# Patient Record
Sex: Female | Born: 1935 | Race: White | Hispanic: No | Marital: Single | Smoking: Never smoker
Health system: Southern US, Community
[De-identification: ages and names within clinical notes are randomized; demographics above are authoritative.]

## PROBLEM LIST (undated history)

## (undated) DIAGNOSIS — M5030 Other cervical disc degeneration, unspecified cervical region: Secondary | ICD-10-CM

## (undated) DIAGNOSIS — F0280 Dementia in other diseases classified elsewhere without behavioral disturbance: Secondary | ICD-10-CM

## (undated) DIAGNOSIS — I6529 Occlusion and stenosis of unspecified carotid artery: Secondary | ICD-10-CM

## (undated) DIAGNOSIS — M8580 Other specified disorders of bone density and structure, unspecified site: Secondary | ICD-10-CM

## (undated) DIAGNOSIS — G309 Alzheimer's disease, unspecified: Secondary | ICD-10-CM

## (undated) DIAGNOSIS — E559 Vitamin D deficiency, unspecified: Secondary | ICD-10-CM

## (undated) DIAGNOSIS — I739 Peripheral vascular disease, unspecified: Secondary | ICD-10-CM

## (undated) DIAGNOSIS — I10 Essential (primary) hypertension: Secondary | ICD-10-CM

## (undated) DIAGNOSIS — E785 Hyperlipidemia, unspecified: Secondary | ICD-10-CM

## (undated) HISTORY — PX: CORONARY ANGIOPLASTY: SHX604

---

## 2017-07-17 ENCOUNTER — Emergency Department: Payer: Medicare Other

## 2017-07-17 ENCOUNTER — Other Ambulatory Visit: Payer: Self-pay

## 2017-07-17 ENCOUNTER — Emergency Department
Admission: EM | Admit: 2017-07-17 | Discharge: 2017-07-17 | Disposition: A | Discharge status: Home / Self Care | Payer: Medicare Other | Attending: Emergency Medicine | Admitting: Emergency Medicine

## 2017-07-17 DIAGNOSIS — W19XXXA Unspecified fall, initial encounter: Secondary | ICD-10-CM

## 2017-07-17 DIAGNOSIS — R8271 Bacteriuria: Secondary | ICD-10-CM | POA: Diagnosis not present

## 2017-07-17 DIAGNOSIS — W010XXA Fall on same level from slipping, tripping and stumbling without subsequent striking against object, initial encounter: Secondary | ICD-10-CM | POA: Insufficient documentation

## 2017-07-17 DIAGNOSIS — F0390 Unspecified dementia without behavioral disturbance: Secondary | ICD-10-CM | POA: Insufficient documentation

## 2017-07-17 DIAGNOSIS — M25552 Pain in left hip: Secondary | ICD-10-CM | POA: Insufficient documentation

## 2017-07-17 DIAGNOSIS — M25532 Pain in left wrist: Secondary | ICD-10-CM | POA: Diagnosis present

## 2017-07-17 LAB — URINALYSIS, COMPLETE (UACMP) WITH MICROSCOPIC
Bilirubin Urine: NEGATIVE
Glucose, UA: NEGATIVE mg/dL
Hgb urine dipstick: NEGATIVE
KETONES UR: NEGATIVE mg/dL
Leukocytes, UA: NEGATIVE
NITRITE: NEGATIVE
PROTEIN: NEGATIVE mg/dL
RBC / HPF: NONE SEEN RBC/hpf (ref 0–5)
Specific Gravity, Urine: 1.008 (ref 1.005–1.030)
WBC UA: NONE SEEN WBC/hpf (ref 0–5)
pH: 6 (ref 5.0–8.0)

## 2017-07-17 LAB — CBC WITH DIFFERENTIAL/PLATELET
Basophils Absolute: 0.1 10*3/uL (ref 0–0.1)
Basophils Relative: 1 %
EOS ABS: 0.1 10*3/uL (ref 0–0.7)
Eosinophils Relative: 1 %
HEMATOCRIT: 40.9 % (ref 35.0–47.0)
HEMOGLOBIN: 13.5 g/dL (ref 12.0–16.0)
Lymphocytes Relative: 21 %
Lymphs Abs: 2 10*3/uL (ref 1.0–3.6)
MCH: 30.8 pg (ref 26.0–34.0)
MCHC: 33.1 g/dL (ref 32.0–36.0)
MCV: 92.9 fL (ref 80.0–100.0)
Monocytes Absolute: 0.7 10*3/uL (ref 0.2–0.9)
Monocytes Relative: 7 %
NEUTROS ABS: 6.7 10*3/uL — AB (ref 1.4–6.5)
NEUTROS PCT: 70 %
Platelets: 268 10*3/uL (ref 150–440)
RBC: 4.4 MIL/uL (ref 3.80–5.20)
RDW: 13.1 % (ref 11.5–14.5)
WBC: 9.7 10*3/uL (ref 3.6–11.0)

## 2017-07-17 LAB — BASIC METABOLIC PANEL
Anion gap: 10 (ref 5–15)
BUN: 26 mg/dL — AB (ref 6–20)
CHLORIDE: 105 mmol/L (ref 101–111)
CO2: 26 mmol/L (ref 22–32)
CREATININE: 0.89 mg/dL (ref 0.44–1.00)
Calcium: 9.2 mg/dL (ref 8.9–10.3)
GFR calc non Af Amer: 53 mL/min — ABNORMAL LOW (ref >60)
Glucose, Bld: 105 mg/dL — ABNORMAL HIGH (ref 65–99)
POTASSIUM: 3.8 mmol/L (ref 3.5–5.1)
SODIUM: 141 mmol/L (ref 135–145)

## 2017-07-17 MED ORDER — ACETAMINOPHEN 325 MG PO TABS
650.0000 mg | ORAL_TABLET | Freq: Once | ORAL | Status: AC
  Administered 2017-07-17: 650 mg via ORAL

## 2017-07-17 NOTE — Discharge Instructions (Addendum)
Please have your doctor follow up your urine culture results.  Please take all precautions to prevent falls.  You may take Tylenol for your pain.  Return to the emergency department for severe pain, numbness tingling or weakness, fever or any other symptoms concerning to you.

## 2017-07-17 NOTE — ED Triage Notes (Signed)
[facility]. This fall was unwitnessed and found on floor. 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  PT is A/ox4 pt has hx of dementia. Awaiting EDP

## 2017-07-17 NOTE — ED Notes (Signed)
Labs sent at this time.

## 2017-07-17 NOTE — ED Provider Notes (Signed)
[facility] Emergency Department Provider Note  ____________________________________________  Time seen: Approximately 7:41 AM  I have reviewed the triage vital signs and the nursing notes.   HISTORY  Chief Complaint Fall    HPI Denise Cantu is a 84 y.o. female with a history of dementia presenting for an unwitnessed fall.  The patient does not remember the fall and is unable to give me any additional information.  She only reports pain in her left wrist at the site where an IV has been placed.  Per report, the patient was found on the ground in her nursing facility.  History reviewed. No pertinent past medical history.  There are no active problems to display for this patient.   History reviewed. No pertinent surgical history.    Allergies Patient has no allergy information on record.  No family history on file.  Social History Social History   Tobacco Use  . Smoking status: Never Smoker  . Smokeless tobacco: Never Used  Substance Use Topics  . Alcohol use: Never    Frequency: Never  . Drug use: Never    Review of Systems Limited due to patient dementia.  ____________________________________________   PHYSICAL EXAM:  VITAL SIGNS: ED Triage Vitals  Enc Vitals Group     BP 07/17/17 0720 (!) 157/68     Pulse --      Resp 07/17/17 0720 19     Temp --      Temp src --      SpO2 07/17/17 0720 99 %     Weight 07/17/17 0721 180 lb (81.6 kg)     Height 07/17/17 0721 5\' 5"  (1.651 m)     Head Circumference --      Peak Flow --      Pain Score 07/17/17 0720 3     Pain Loc --      Pain Edu? --      Excl. in GC? --     Constitutional: Patient is alert and oriented to person and place but not year.  She is well-appearing for her age and nontoxic in appearance. Eyes: Conjunctivae are normal.  EOMI. No scleral icterus.  No raccoon eyes. Head: Atraumatic.  No battle sign. Nose: No congestion/rhinnorhea.  No swelling over the nose or  septal hematoma. Mouth/Throat: Mucous membranes are moist.  No dental injury or malocclusion. Neck: No stridor.  Supple.  No meningismus.  No midline C-spine tenderness to palpation, step-offs or deformities. Cardiovascular: Normal rate, regular rhythm. No murmurs, rubs or gallops.  Respiratory: Normal respiratory effort.  No accessory muscle use or retractions. Lungs CTAB.  No wheezes, rales or ronchi. Gastrointestinal: Soft, nontender and nondistended.  No guarding or rebound.  No peritoneal signs. Musculoskeletal: Pelvis is stable.  The patient does have pain with range of motion of the left hip.  She has full range of motion without pain of the bilateral shoulders, elbows, wrists, right hip, bilateral knees and ankles.  Normal DP and PT pulses bilaterally.  The patient has focal tenderness to palpation over the IV site in the left wrist without pain with range of motion.  No LE edema. No ttp in the calves or palpable cords.  Negative Homan's sign. Neurologic:  A&Ox2.  Speech is clear.  Face and smile are symmetric.  EOMI. LI.  Moves all extremities well. Skin:  Skin is warm, dry and intact. No rash noted. Psychiatric: Mood and affect are normal. ____________________________________________   LABS (all labs ordered are listed, but  only abnormal results are displayed)  Labs Reviewed  CBC WITH DIFFERENTIAL/PLATELET - Abnormal; Notable for the following components:      Result Value   Neutro Abs 6.7 (*)    All other components within normal limits  BASIC METABOLIC PANEL - Abnormal; Notable for the following components:   Glucose, Bld 105 (*)    BUN 26 (*)    GFR calc non Af Amer 53 (*)    All other components within normal limits  URINALYSIS, COMPLETE (UACMP) WITH MICROSCOPIC - Abnormal; Notable for the following components:   Color, Urine STRAW (*)    APPearance CLEAR (*)    Bacteria, UA RARE (*)    Squamous Epithelial / LPF 0-5 (*)    All other components within normal limits  URINE  CULTURE  TROPONIN I   ____________________________________________  EKG  ED ECG REPORT I, Rockne MenghiniNorman, Anne-Caroline, the attending physician, personally viewed and interpreted this ECG.   Date: 07/17/2017  EKG Time: 722  Rate: 78  Rhythm: normal sinus rhythm  Axis: leftward  Intervals:none  ST&T Change: No STEMI  ____________________________________________  RADIOLOGY  Ct Head Wo Contrast  Result Date: 07/17/2017 CLINICAL DATA:  Unwitnessed fall.  Dementia EXAM: CT HEAD WITHOUT CONTRAST TECHNIQUE: Contiguous axial images were obtained from the base of the skull through the vertex without intravenous contrast. COMPARISON:  None. FINDINGS: Brain: There is moderate diffuse atrophy. There is no intracranial mass, hemorrhage, extra-axial fluid collection, or midline shift. There is patchy small vessel disease throughout the centra semiovale bilaterally. Small vessel disease is noted in each thalamus. No acute infarct evident. Vascular: No hyperdense vessels are evident. There is calcification in the distal right vertebral artery as well as in each carotid siphon region. Skull: Bony calvarium appears intact. Sinuses/Orbits: There is opacification in multiple ethmoid air cells, more on the right than on the left. There is complete opacification of sphenoid sinuses bilaterally with areas of calcification in the left sphenoid sinus. There is bony expansion along the posterior aspect of the sphenoid sinus region. Visualized orbits appear symmetric bilaterally. Other: There is opacification in several mastoid air cells medially on the right. Other visualized paranasal sinuses are clear. There is debris in each external auditory canal. There is extensive degenerative change in the left temporomandibular joint. IMPRESSION: 1. Atrophy with supratentorial small vessel disease. No acute infarct. No mass or hemorrhage. 2.  Foci of arterial vascular calcification. 3. Diffuse opacification of the sphenoid sinuses  bilaterally with areas of calcification, likely due to chronic fungal colonization. Expansion of opacification from the sphenoid sinuses posteriorly noted, likely with polypoid change in these areas. No lytic appearing bone lesion evident. There is also fairly extensive ethmoid sinus disease. 4. Mild medial right mastoid disease. Probable cerumen in each external auditory canal. 5. Extensive degenerative change in the left temporomandibular joint. Electronically Signed   By: Bretta BangWilliam  Woodruff III M.D.   On: 07/17/2017 08:06   Ct Hip Left Wo Contrast  Result Date: 07/17/2017 CLINICAL DATA:  Status post fall.  Left hip pain. EXAM: CT OF THE LEFT HIP WITHOUT CONTRAST TECHNIQUE: Multidetector CT imaging of the left hip was performed according to the standard protocol. Multiplanar CT image reconstructions were also generated. COMPARISON:  None. FINDINGS: Bones/Joint/Cartilage No acute fracture or dislocation. No aggressive osseous lesion. Minimal left hip joint space narrowing. Normal alignment. No joint effusion. Ligaments Ligaments are suboptimally evaluated by CT. Muscles and Tendons Muscles are normal.  No muscle atrophy. Soft tissue No fluid collection or hematoma.  No soft tissue mass. Diverticulosis of the sigmoid colon without evidence of diverticulitis. Peripheral vascular atherosclerotic disease. IMPRESSION: No acute osseous injury of the left hip. Electronically Signed   By: Elige Ko   On: 07/17/2017 10:10   Dg Hip Unilat W Or Wo Pelvis 2-3 Views Left  Result Date: 07/17/2017 CLINICAL DATA:  Pain following fall EXAM: DG HIP (WITH OR WITHOUT PELVIS) 2-3V LEFT COMPARISON:  None. FINDINGS: Frontal pelvis as well as frontal and lateral left hip images were obtained. There is no evident fracture or dislocation. There is moderate narrowing of both hip joints. Bones are osteoporotic. There is calcification in the right and left common iliac arteries, more notable on the right than on the left. Sacroiliac  joints appear normal. IMPRESSION: Moderate narrowing each hip joint. Bones osteoporotic. No acute fracture or dislocation. Electronically Signed   By: Bretta Bang III M.D.   On: 07/17/2017 08:33    ____________________________________________   PROCEDURES  Procedure(s) performed: None  Procedures  Critical Care performed: No ____________________________________________   INITIAL IMPRESSION / ASSESSMENT AND PLAN / ED COURSE  Pertinent labs & imaging results that were available during my care of the patient were reviewed by me and considered in my medical decision making (see chart for details).  84 y.o. female with dementia presenting for an unwitnessed fall, has left hip pain on my examination.  Overall, the patient is hemodynamically stable.  Given that she is unable to give any history, will obtain a CT scan of her head, imaging of her left hip, and basic laboratory studies including urinalysis.  An EKG has also been ordered.  I will treat the patient's pain with Tylenol and reevaluate her for final disposition.  Clinical course:  The patient's workup in the emergency department was reassuring.  She had an x-ray that was negative but given her hip pain, I did proceed with a CT scan which was also negative for any acute fracture.  The patient CT head was negative.  The patient's laboratory studies, EKG, were reassuring.  Clinically, she felt significantly better after Tylenol.  I had a long discussion with the patient and her son about her findings.  The patient was discharged in stable condition with close follow-up instructions; return precautions were discussed  ____________________________________________  FINAL CLINICAL IMPRESSION(S) / ED DIAGNOSES  Final diagnoses:  Bacteriuria  Left hip pain  Fall, initial encounter         NEW MEDICATIONS STARTED DURING THIS VISIT:  There are no discharge medications for this patient.     Rockne Menghini,  MD 07/17/17 1459

## 2017-07-19 LAB — URINE CULTURE

## 2017-07-21 ENCOUNTER — Observation Stay
Admission: EM | Admit: 2017-07-21 | Discharge: 2017-07-22 | Disposition: A | Discharge status: Nursing Home (Includes ICF) | Payer: Medicare Other | Attending: Internal Medicine | Admitting: Internal Medicine

## 2017-07-21 ENCOUNTER — Emergency Department: Payer: Medicare Other

## 2017-07-21 ENCOUNTER — Other Ambulatory Visit: Payer: Self-pay

## 2017-07-21 ENCOUNTER — Observation Stay: Payer: Medicare Other

## 2017-07-21 ENCOUNTER — Encounter: Payer: Self-pay | Admitting: Emergency Medicine

## 2017-07-21 DIAGNOSIS — Z8249 Family history of ischemic heart disease and other diseases of the circulatory system: Secondary | ICD-10-CM | POA: Diagnosis not present

## 2017-07-21 DIAGNOSIS — E559 Vitamin D deficiency, unspecified: Secondary | ICD-10-CM | POA: Diagnosis not present

## 2017-07-21 DIAGNOSIS — F0280 Dementia in other diseases classified elsewhere without behavioral disturbance: Secondary | ICD-10-CM | POA: Insufficient documentation

## 2017-07-21 DIAGNOSIS — Y998 Other external cause status: Secondary | ICD-10-CM | POA: Insufficient documentation

## 2017-07-21 DIAGNOSIS — S42212A Unspecified displaced fracture of surgical neck of left humerus, initial encounter for closed fracture: Principal | ICD-10-CM | POA: Insufficient documentation

## 2017-07-21 DIAGNOSIS — I10 Essential (primary) hypertension: Secondary | ICD-10-CM | POA: Insufficient documentation

## 2017-07-21 DIAGNOSIS — W050XXA Fall from non-moving wheelchair, initial encounter: Secondary | ICD-10-CM | POA: Diagnosis not present

## 2017-07-21 DIAGNOSIS — I739 Peripheral vascular disease, unspecified: Secondary | ICD-10-CM | POA: Diagnosis not present

## 2017-07-21 DIAGNOSIS — S7002XA Contusion of left hip, initial encounter: Secondary | ICD-10-CM | POA: Insufficient documentation

## 2017-07-21 DIAGNOSIS — M47812 Spondylosis without myelopathy or radiculopathy, cervical region: Secondary | ICD-10-CM | POA: Insufficient documentation

## 2017-07-21 DIAGNOSIS — Z7982 Long term (current) use of aspirin: Secondary | ICD-10-CM | POA: Diagnosis not present

## 2017-07-21 DIAGNOSIS — G309 Alzheimer's disease, unspecified: Secondary | ICD-10-CM | POA: Insufficient documentation

## 2017-07-21 DIAGNOSIS — M160 Bilateral primary osteoarthritis of hip: Secondary | ICD-10-CM | POA: Diagnosis not present

## 2017-07-21 DIAGNOSIS — I6523 Occlusion and stenosis of bilateral carotid arteries: Secondary | ICD-10-CM | POA: Insufficient documentation

## 2017-07-21 DIAGNOSIS — Z79899 Other long term (current) drug therapy: Secondary | ICD-10-CM | POA: Diagnosis not present

## 2017-07-21 DIAGNOSIS — Z66 Do not resuscitate: Secondary | ICD-10-CM | POA: Diagnosis not present

## 2017-07-21 DIAGNOSIS — M8580 Other specified disorders of bone density and structure, unspecified site: Secondary | ICD-10-CM | POA: Diagnosis not present

## 2017-07-21 DIAGNOSIS — E785 Hyperlipidemia, unspecified: Secondary | ICD-10-CM | POA: Insufficient documentation

## 2017-07-21 DIAGNOSIS — Y9389 Activity, other specified: Secondary | ICD-10-CM | POA: Insufficient documentation

## 2017-07-21 DIAGNOSIS — M25412 Effusion, left shoulder: Secondary | ICD-10-CM | POA: Diagnosis not present

## 2017-07-21 DIAGNOSIS — Y92129 Unspecified place in nursing home as the place of occurrence of the external cause: Secondary | ICD-10-CM | POA: Diagnosis not present

## 2017-07-21 DIAGNOSIS — S42309A Unspecified fracture of shaft of humerus, unspecified arm, initial encounter for closed fracture: Secondary | ICD-10-CM | POA: Diagnosis present

## 2017-07-21 DIAGNOSIS — E860 Dehydration: Secondary | ICD-10-CM | POA: Insufficient documentation

## 2017-07-21 HISTORY — DX: Essential (primary) hypertension: I10

## 2017-07-21 HISTORY — DX: Dementia in other diseases classified elsewhere, unspecified severity, without behavioral disturbance, psychotic disturbance, mood disturbance, and anxiety: F02.80

## 2017-07-21 HISTORY — DX: Peripheral vascular disease, unspecified: I73.9

## 2017-07-21 HISTORY — DX: Other cervical disc degeneration, unspecified cervical region: M50.30

## 2017-07-21 HISTORY — DX: Other specified disorders of bone density and structure, unspecified site: M85.80

## 2017-07-21 HISTORY — DX: Alzheimer's disease, unspecified: G30.9

## 2017-07-21 HISTORY — DX: Vitamin D deficiency, unspecified: E55.9

## 2017-07-21 HISTORY — DX: Hyperlipidemia, unspecified: E78.5

## 2017-07-21 HISTORY — DX: Occlusion and stenosis of unspecified carotid artery: I65.29

## 2017-07-21 LAB — CBC WITH DIFFERENTIAL/PLATELET
Basophils Absolute: 0.1 10*3/uL (ref 0–0.1)
Basophils Relative: 1 %
EOS PCT: 2 %
Eosinophils Absolute: 0.2 10*3/uL (ref 0–0.7)
HCT: 40.2 % (ref 35.0–47.0)
Hemoglobin: 13.3 g/dL (ref 12.0–16.0)
LYMPHS ABS: 2.2 10*3/uL (ref 1.0–3.6)
LYMPHS PCT: 23 %
MCH: 30.4 pg (ref 26.0–34.0)
MCHC: 33 g/dL (ref 32.0–36.0)
MCV: 92.3 fL (ref 80.0–100.0)
MONO ABS: 0.8 10*3/uL (ref 0.2–0.9)
MONOS PCT: 8 %
Neutro Abs: 6.6 10*3/uL — ABNORMAL HIGH (ref 1.4–6.5)
Neutrophils Relative %: 66 %
PLATELETS: 250 10*3/uL (ref 150–440)
RBC: 4.36 MIL/uL (ref 3.80–5.20)
RDW: 13.3 % (ref 11.5–14.5)
WBC: 9.9 10*3/uL (ref 3.6–11.0)

## 2017-07-21 LAB — COMPREHENSIVE METABOLIC PANEL
ALT: 15 U/L (ref 14–54)
AST: 21 U/L (ref 15–41)
Albumin: 3.9 g/dL (ref 3.5–5.0)
Alkaline Phosphatase: 85 U/L (ref 38–126)
Anion gap: 7 (ref 5–15)
BUN: 29 mg/dL — ABNORMAL HIGH (ref 6–20)
CHLORIDE: 103 mmol/L (ref 101–111)
CO2: 27 mmol/L (ref 22–32)
CREATININE: 0.92 mg/dL (ref 0.44–1.00)
Calcium: 9.1 mg/dL (ref 8.9–10.3)
GFR, EST AFRICAN AMERICAN: 59 mL/min — AB (ref >60)
GFR, EST NON AFRICAN AMERICAN: 51 mL/min — AB (ref >60)
Glucose, Bld: 116 mg/dL — ABNORMAL HIGH (ref 65–99)
POTASSIUM: 3.7 mmol/L (ref 3.5–5.1)
Sodium: 137 mmol/L (ref 135–145)
TOTAL PROTEIN: 6.7 g/dL (ref 6.5–8.1)
Total Bilirubin: 0.7 mg/dL (ref 0.3–1.2)

## 2017-07-21 LAB — TROPONIN I

## 2017-07-21 LAB — MRSA PCR SCREENING: MRSA by PCR: NEGATIVE

## 2017-07-21 MED ORDER — HYDRALAZINE HCL 20 MG/ML IJ SOLN
INTRAMUSCULAR | Status: AC
  Filled 2017-07-21: qty 1

## 2017-07-21 MED ORDER — HALOPERIDOL LACTATE 5 MG/ML IJ SOLN
0.5000 mg | Freq: Four times a day (QID) | INTRAMUSCULAR | Status: DC | PRN
  Administered 2017-07-21 – 2017-07-22 (×2): 0.5 mg via INTRAVENOUS
  Filled 2017-07-21 (×2): qty 1

## 2017-07-21 MED ORDER — HYDROCODONE-ACETAMINOPHEN 5-325 MG PO TABS: 1.0000 | ORAL_TABLET | ORAL | Status: DC | PRN

## 2017-07-21 MED ORDER — ENOXAPARIN SODIUM 40 MG/0.4ML ~~LOC~~ SOLN
40.0000 mg | SUBCUTANEOUS | Status: DC
  Administered 2017-07-21: 40 mg via SUBCUTANEOUS
  Filled 2017-07-21 (×2): qty 0.4

## 2017-07-21 MED ORDER — VITAMIN D3 25 MCG (1000 UNIT) PO TABS
1000.0000 [IU] | ORAL_TABLET | Freq: Every day | ORAL | Status: DC
  Administered 2017-07-21 – 2017-07-22 (×2): 1000 [IU] via ORAL
  Filled 2017-07-21 (×4): qty 1

## 2017-07-21 MED ORDER — TRAZODONE HCL 50 MG PO TABS
25.0000 mg | ORAL_TABLET | Freq: Every day | ORAL | Status: DC
  Administered 2017-07-21: 25 mg via ORAL
  Filled 2017-07-21: qty 1

## 2017-07-21 MED ORDER — ACETAMINOPHEN 650 MG RE SUPP: 650.0000 mg | Freq: Four times a day (QID) | RECTAL | Status: DC | PRN

## 2017-07-21 MED ORDER — FENTANYL CITRATE (PF) 100 MCG/2ML IJ SOLN
50.0000 ug | Freq: Once | INTRAMUSCULAR | Status: AC
  Administered 2017-07-21: 50 ug via INTRAVENOUS

## 2017-07-21 MED ORDER — BISACODYL 5 MG PO TBEC: 5.0000 mg | DELAYED_RELEASE_TABLET | Freq: Every day | ORAL | Status: DC | PRN

## 2017-07-21 MED ORDER — HYDROCODONE-ACETAMINOPHEN 5-325 MG PO TABS
1.0000 | ORAL_TABLET | ORAL | Status: DC | PRN
  Administered 2017-07-21: 1 via ORAL
  Filled 2017-07-21: qty 1

## 2017-07-21 MED ORDER — SODIUM CHLORIDE 0.9 % IV SOLN
INTRAVENOUS | Status: DC
  Administered 2017-07-21: 09:00:00 via INTRAVENOUS

## 2017-07-21 MED ORDER — ALBUTEROL SULFATE (2.5 MG/3ML) 0.083% IN NEBU: 2.5000 mg | INHALATION_SOLUTION | RESPIRATORY_TRACT | Status: DC | PRN

## 2017-07-21 MED ORDER — NITROGLYCERIN 0.4 MG SL SUBL: 0.4000 mg | SUBLINGUAL_TABLET | SUBLINGUAL | Status: DC | PRN

## 2017-07-21 MED ORDER — CALCIUM CARBONATE-VITAMIN D 600-400 MG-UNIT PO TABS: 1.0000 | ORAL_TABLET | Freq: Every day | ORAL | Status: DC

## 2017-07-21 MED ORDER — GLUCOSAMINE-CHONDROITIN 500-400 MG PO TABS: 1.0000 | ORAL_TABLET | Freq: Every day | ORAL | Status: DC

## 2017-07-21 MED ORDER — NAPROXEN SODIUM 220 MG PO TABS: 220.0000 mg | ORAL_TABLET | Freq: Two times a day (BID) | ORAL | Status: DC

## 2017-07-21 MED ORDER — HYDRALAZINE HCL 20 MG/ML IJ SOLN
10.0000 mg | Freq: Four times a day (QID) | INTRAMUSCULAR | Status: DC | PRN
  Administered 2017-07-21: 10 mg via INTRAVENOUS

## 2017-07-21 MED ORDER — FENTANYL CITRATE (PF) 100 MCG/2ML IJ SOLN
INTRAMUSCULAR | Status: AC
  Filled 2017-07-21: qty 2

## 2017-07-21 MED ORDER — CALCIUM CARBONATE-VITAMIN D 500-200 MG-UNIT PO TABS
1.0000 | ORAL_TABLET | Freq: Every day | ORAL | Status: DC
  Administered 2017-07-22: 07:00:00 1 via ORAL
  Filled 2017-07-21: qty 1

## 2017-07-21 MED ORDER — KETOROLAC TROMETHAMINE 15 MG/ML IJ SOLN: 15.0000 mg | Freq: Four times a day (QID) | INTRAMUSCULAR | Status: DC | PRN

## 2017-07-21 MED ORDER — ONDANSETRON HCL 4 MG/2ML IJ SOLN: 4.0000 mg | Freq: Four times a day (QID) | INTRAMUSCULAR | Status: DC | PRN

## 2017-07-21 MED ORDER — ATORVASTATIN CALCIUM 20 MG PO TABS
80.0000 mg | ORAL_TABLET | Freq: Every day | ORAL | Status: DC
  Administered 2017-07-21 – 2017-07-22 (×2): 80 mg via ORAL
  Filled 2017-07-21 (×2): qty 4

## 2017-07-21 MED ORDER — ISOSORBIDE MONONITRATE ER 30 MG PO TB24
30.0000 mg | ORAL_TABLET | Freq: Every day | ORAL | Status: DC
  Administered 2017-07-21 – 2017-07-22 (×2): 30 mg via ORAL
  Filled 2017-07-21 (×2): qty 1

## 2017-07-21 MED ORDER — SENNOSIDES-DOCUSATE SODIUM 8.6-50 MG PO TABS: 1.0000 | ORAL_TABLET | Freq: Every evening | ORAL | Status: DC | PRN

## 2017-07-21 MED ORDER — ONDANSETRON HCL 4 MG PO TABS: 4.0000 mg | ORAL_TABLET | Freq: Four times a day (QID) | ORAL | Status: DC | PRN

## 2017-07-21 MED ORDER — ASPIRIN EC 81 MG PO TBEC
81.0000 mg | DELAYED_RELEASE_TABLET | Freq: Every day | ORAL | Status: DC
  Administered 2017-07-21 – 2017-07-22 (×2): 81 mg via ORAL
  Filled 2017-07-21 (×2): qty 1

## 2017-07-21 MED ORDER — DOCUSATE SODIUM 100 MG PO CAPS
100.0000 mg | ORAL_CAPSULE | Freq: Two times a day (BID) | ORAL | Status: DC
  Administered 2017-07-21 – 2017-07-22 (×3): 100 mg via ORAL
  Filled 2017-07-21 (×3): qty 1

## 2017-07-21 MED ORDER — NAPROXEN 250 MG PO TABS
250.0000 mg | ORAL_TABLET | Freq: Two times a day (BID) | ORAL | Status: DC
  Filled 2017-07-21: qty 1

## 2017-07-21 MED ORDER — ACETAMINOPHEN 325 MG PO TABS: 650.0000 mg | ORAL_TABLET | Freq: Four times a day (QID) | ORAL | Status: DC | PRN

## 2017-07-21 NOTE — ED Notes (Signed)
Shoulder immobilizer placed. Pt tolerated well.

## 2017-07-21 NOTE — Consult Note (Signed)
ORTHOPAEDIC CONSULTATION  REQUESTING PHYSICIAN: Shaune Pollack, MD  Chief Complaint:   Left shoulder and left lateral hip pain.  History of Present Illness: Denise Cantu is a 84 y.o. female with multiple medical problems including peripheral vascular disease, hyperlipidemia, hypertension, carotid artery stenosis, and Alzheimer's dementia who lives in a nursing home and is primarily wheelchair dependent was in her usual state of health yesterday.  Last evening, she apparently fell out of her wheelchair onto her left side, injuring her left shoulder and her left hip.  She was brought to the emergency room where x-rays of the left shoulder and left hip were obtained with the findings as described below.  The patient states that she may have struck her head but is unsure.  Given her dementia, she is a very poor historian and cannot describe why she actually fell.  Therefore, the patient was admitted for further workup and for pain control.  Past Medical History:  Diagnosis Date  . Alzheimer's dementia   . Carotid artery stenosis   . DDD (degenerative disc disease), cervical   . HTN (hypertension)   . Hyperlipemia   . Osteopenia   . PVD (peripheral vascular disease) (HCC)   . Vitamin D deficiency    Past Surgical History:  Procedure Laterality Date  . CORONARY ANGIOPLASTY     Social History   Socioeconomic History  . Marital status: Single    Spouse name: Not on file  . Number of children: Not on file  . Years of education: Not on file  . Highest education level: Not on file  Occupational History  . Not on file  Social Needs  . Financial resource strain: Not on file  . Food insecurity:    Worry: Not on file    Inability: Not on file  . Transportation needs:    Medical: Not on file    Non-medical: Not on file  Tobacco Use  . Smoking status: Never Smoker  . Smokeless tobacco: Never Used  Substance and Sexual Activity   . Alcohol use: Never    Frequency: Never  . Drug use: Never  . Sexual activity: Never  Lifestyle  . Physical activity:    Days per week: Not on file    Minutes per session: Not on file  . Stress: Not on file  Relationships  . Social connections:    Talks on phone: Not on file    Gets together: Not on file    Attends religious service: Not on file    Active member of club or organization: Not on file    Attends meetings of clubs or organizations: Not on file    Relationship status: Not on file  Other Topics Concern  . Not on file  Social History Narrative  . Not on file   Family History  Problem Relation Age of Onset  . CAD Mother   . CAD Sister   . CAD Brother    No Known Allergies Prior to Admission medications   Medication Sig Start Date End Date Taking? Authorizing Provider  acetaminophen (TYLENOL) 500 MG tablet Take 1,000 mg by mouth 3 (three) times daily.   Yes [provider]  aspirin EC 81 MG tablet Take 81 mg by mouth daily.   Yes [provider]  atorvastatin (LIPITOR) 80 MG tablet Take 80 mg by mouth daily.   Yes [provider]  Calcium Carbonate-Vitamin D (CALCIUM 600+D) 600-400 MG-UNIT tablet Take 1 tablet by mouth daily.   Yes [provider]  cholecalciferol (VITAMIN D) 1000 units tablet Take 1,000 Units by mouth daily.   Yes [provider]  glucosamine-chondroitin 500-400 MG tablet Take 1 tablet by mouth daily.   Yes [provider]  hydrochlorothiazide (MICROZIDE) 12.5 MG capsule Take 12.5 mg by mouth daily.   Yes [provider]  isosorbide mononitrate (IMDUR) 30 MG 24 hr tablet Take 30 mg by mouth daily.   Yes [provider]  lidocaine (LIDODERM) 5 % Place 1 patch onto the skin daily. Remove & Discard patch within 12 hours or as directed by MD Apply to right hip   Yes [provider]  naproxen sodium (ALEVE) 220 MG tablet Take 220 mg by mouth 2 (two) times daily.   Yes  [provider]  nitroGLYCERIN (NITROSTAT) 0.4 MG SL tablet Place 0.4 mg under the tongue every 5 (five) minutes as needed for chest pain.   Yes [provider]  traZODone (DESYREL) 50 MG tablet Take 25 mg by mouth at bedtime.   Yes [provider]   Ct Head Wo Contrast  Result Date: 07/21/2017 CLINICAL DATA:  84 y/o  F; unwitnessed fall, history of dementia. EXAM: CT HEAD WITHOUT CONTRAST CT CERVICAL SPINE WITHOUT CONTRAST TECHNIQUE: Multidetector CT imaging of the head and cervical spine was performed following the standard protocol without intravenous contrast. Multiplanar CT image reconstructions of the cervical spine were also generated. COMPARISON:  07/17/2017 CT head FINDINGS: CT HEAD FINDINGS Brain: No evidence of acute infarction, hemorrhage, hydrocephalus, extra-axial collection or mass lesion/mass effect. Nonspecific foci of hypoattenuation within subcortical and periventricular white matter is compatible with moderate chronic microvascular ischemic changes and there is moderate brain parenchymal volume loss. Vascular: Calcific atherosclerosis of the carotid siphons. No hyperdense vessel. Skull: Normal. Negative for fracture or focal lesion. Sinuses/Orbits: Opacification of the sphenoid sinus with expansile soft tissue attenuation, amorphous calcifications, and erosion into the clivus. Otherwise normal aeration of paranasal sinuses. Normal aeration of mastoid air cells. Bilateral intra-ocular lens replacement. Other: None. CT CERVICAL SPINE FINDINGS Alignment: Straightening of cervical lordosis. C7-T1 grade 1 anterolisthesis. Skull base and vertebrae: No acute fracture. No primary bone lesion or focal pathologic process. Soft tissues and spinal canal: No prevertebral fluid or swelling. No visible canal hematoma. Disc levels: Moderate cervical spondylosis with multilevel disc and facet degenerative changes. C3-C7 uncovertebral and facet hypertrophy results in bony foraminal  stenosis. Multilevel mild to moderate stenosis greatest at the C5-6 level. Upper chest: Right lung apex pleuroparenchymal scarring. Other: Left lobe of thyroid nodule measuring up to 23 mm. Calcific atherosclerosis of the carotid siphons. IMPRESSION: CT head: 1. No acute intracranial abnormality or calvarial fracture identified. 2. Moderate chronic microvascular ischemic changes and parenchymal volume loss of the brain. 3. Expansile opacification of the sphenoid sinus with calcifications eroding into the clivus. Findings probably represent a large sinus polyp or mucocele, less likely neoplasm arising from clivus. Nonemergent skull base MRI with and without contrast. CT cervical spine: 1. No acute fracture or dislocation identified. 2. 23 mm nodule within left lobe of thyroid. Nonemergent thyroid ultrasound is recommended. 3. Moderate spondylosis of the cervical spine. Electronically Signed   By: Mitzi HansenLance  Furusawa-Stratton M.D.   On: 07/21/2017 04:24   Ct Cervical Spine Wo Contrast  Result Date: 07/21/2017 CLINICAL DATA:  84 y/o  F; unwitnessed fall, history of dementia. EXAM: CT HEAD WITHOUT CONTRAST CT CERVICAL SPINE WITHOUT CONTRAST TECHNIQUE: Multidetector CT imaging of the head and cervical spine was performed following the standard protocol  without intravenous contrast. Multiplanar CT image reconstructions of the cervical spine were also generated. COMPARISON:  07/17/2017 CT head FINDINGS: CT HEAD FINDINGS Brain: No evidence of acute infarction, hemorrhage, hydrocephalus, extra-axial collection or mass lesion/mass effect. Nonspecific foci of hypoattenuation within subcortical and periventricular white matter is compatible with moderate chronic microvascular ischemic changes and there is moderate brain parenchymal volume loss. Vascular: Calcific atherosclerosis of the carotid siphons. No hyperdense vessel. Skull: Normal. Negative for fracture or focal lesion. Sinuses/Orbits: Opacification of the sphenoid sinus  with expansile soft tissue attenuation, amorphous calcifications, and erosion into the clivus. Otherwise normal aeration of paranasal sinuses. Normal aeration of mastoid air cells. Bilateral intra-ocular lens replacement. Other: None. CT CERVICAL SPINE FINDINGS Alignment: Straightening of cervical lordosis. C7-T1 grade 1 anterolisthesis. Skull base and vertebrae: No acute fracture. No primary bone lesion or focal pathologic process. Soft tissues and spinal canal: No prevertebral fluid or swelling. No visible canal hematoma. Disc levels: Moderate cervical spondylosis with multilevel disc and facet degenerative changes. C3-C7 uncovertebral and facet hypertrophy results in bony foraminal stenosis. Multilevel mild to moderate stenosis greatest at the C5-6 level. Upper chest: Right lung apex pleuroparenchymal scarring. Other: Left lobe of thyroid nodule measuring up to 23 mm. Calcific atherosclerosis of the carotid siphons. IMPRESSION: CT head: 1. No acute intracranial abnormality or calvarial fracture identified. 2. Moderate chronic microvascular ischemic changes and parenchymal volume loss of the brain. 3. Expansile opacification of the sphenoid sinus with calcifications eroding into the clivus. Findings probably represent a large sinus polyp or mucocele, less likely neoplasm arising from clivus. Nonemergent skull base MRI with and without contrast. CT cervical spine: 1. No acute fracture or dislocation identified. 2. 23 mm nodule within left lobe of thyroid. Nonemergent thyroid ultrasound is recommended. 3. Moderate spondylosis of the cervical spine. Electronically Signed   By: Mitzi Hansen M.D.   On: 07/21/2017 04:24   Ct Shoulder Left Wo Contrast  Result Date: 07/21/2017 CLINICAL DATA:  Humerus fracture after fall yesterday. EXAM: CT OF THE UPPER LEFT EXTREMITY WITHOUT CONTRAST TECHNIQUE: Multidetector CT imaging of the upper left extremity was performed according to the standard protocol. COMPARISON:   Left shoulder x-rays from same day. FINDINGS: Bones/Joint/Cartilage There is a comminuted, oblique fracture through the surgical neck of the humerus with anterior displacement of the humeral diaphysis thigh approximately 2.0 cm. The humeral head remains opposed to the glenoid, but is significantly internally rotated and slightly abducted. There is a large glenohumeral joint effusion. The bones are osteopenic. Ligaments Suboptimally assessed by CT. Muscles and Tendons The rotator cuff is grossly intact.  No significant muscle atrophy. Soft tissues Emphysematous changes in the left lung. Partially visualized enlarged left thyroid lobe containing a 2.4 cm hypodense nodule. IMPRESSION: 1. Comminuted, oblique fracture through the surgical neck of the proximal humerus with approximately 2 cm anterior displacement. 2. No dislocation.  Large glenohumeral joint effusion. Electronically Signed   By: Obie Dredge M.D.   On: 07/21/2017 11:43   Dg Shoulder Left  Result Date: 07/21/2017 CLINICAL DATA:  Left shoulder pain after unwitnessed fall. EXAM: LEFT SHOULDER - 2+ VIEW COMPARISON:  None. FINDINGS: Significantly displaced fracture through the surgical neck of the humerus with greater than 1 shaft with anterior displacement of the humeral shaft. Glenohumeral alignment is difficult to assess, humeral head likely remains articulating with the glenoid. The acromioclavicular joint is congruent. IMPRESSION: Significantly displaced fracture through the surgical neck of the humerus. Glenohumeral alignment is difficult to assess but likely congruent. Electronically Signed  By: Rubye Oaks M.D.   On: 07/21/2017 04:05   Dg Hip Unilat With Pelvis 2-3 Views Left  Result Date: 07/21/2017 CLINICAL DATA:  84 y/o F; left hip and shoulder pain with unwitnessed fall. EXAM: DG HIP (WITH OR WITHOUT PELVIS) 2-3V LEFT COMPARISON:  07/17/2017 pelvis and left lower extremity radiographs. FINDINGS: There is no evidence of hip fracture  or dislocation. Mild osteoarthrosis of the hip joints with loss of joint space and osteophytosis. Vascular calcifications noted. Lower lumbar spondylosis partially visualized. Bones are demineralized. IMPRESSION: No acute bony or articular abnormality identified. Electronically Signed   By: Mitzi Hansen M.D.   On: 07/21/2017 04:04    Positive ROS: All other systems have been reviewed and were otherwise negative with the exception of those mentioned in the HPI and as above.  Physical Exam: General:  Alert, no acute distress Psychiatric:  Patient is not competent for consent, although she demonstrates normal mood and affect   Cardiovascular:  No pedal edema Respiratory:  No wheezing, non-labored breathing GI:  Abdomen is soft and non-tender Skin:  No lesions in the area of chief complaint Neurologic:  Sensation intact distally Lymphatic:  No axillary or cervical lymphadenopathy  Orthopedic Exam:  Orthopedic examination is limited to the left shoulder and upper extremity.  There is moderate swelling over the anterior aspect of the shoulder but no evidence for ecchymosis, erythema, or other skin abnormalities are identified.  She has moderate tenderness to palpation over the anterior aspect of the shoulder.  She has significant pain with any attempted active or passive motion of the shoulder.  She is able to actively flex and extend her wrist and all digits without any pain.  She has more pain with attempted elbow range of motion.  Sensation is intact to all distributions, including the axillary nerve and lateral antebrachial cutaneous nerve (musculocutaneous nerve) distributions.  She has good capillary refill to her left hand.  Examination of the left hip demonstrates no skin abnormalities.  She has moderate tenderness to palpation focally over the lateral trochanteric region.  She has no tenderness over the iliac wing, anteriorly, or more posteriorly.  She has some discomfort with  attempted passive hip range of motion.  She is neurovascularly intact to the left lower extremity and foot.  X-rays:  X-rays of the left shoulder are available for review.  The findings are as described above.  There is a displaced extra-articular fracture of the left proximal humerus with some comminution.  The humeral head appears to be located within the glenoid.  No significant degenerative changes or lytic lesions are identified.  X-rays of the pelvis and left hip also are available for review.  The findings are as described above.  There does not appear to be any evidence for occult fractures in the proximal femur, acetabular, or pubic rami region.  No significant degenerative changes or lytic lesions are identified.  Assessment: 1.  Closed displaced essentially 2-part left proximal humerus fracture.. 2.  Left lateral hip contusion.  Plan: The treatment options have been discussed with the patient.  Based on her age, multiple comorbidities, and limited activities on a day-to-day basis, I feel that the proximal humerus fracture should be given a chance to heal by itself.  Therefore, she is to remain in her shoulder immobilizer at all times, removing only for bathing purposes.  Appropriate pain medication may be prescribed.  She will follow-up in my office in 2-3 weeks for repeat x-rays of this fracture.  In  many cases, as the muscles relax while the patient is in a shoulder immobilizer, the fracture can slide back out to length and reduce itself more effectively.  Regarding her left hip, she may apply ice or ice alternating with heat and continue to take over-the-counter medications as needed for discomfort.  She may gradually be mobilized as tolerated with physical therapy.  Thank you for asked me to participate in the care of this most unfortunate woman.  Please have her follow-up in my office in about 2-3 weeks.     Maryagnes Amos, MD  Beeper #:  [phone]  07/21/2017 2:24  PM

## 2017-07-21 NOTE — Progress Notes (Signed)
Pt becoming impulsive. Pulled out IV and keeps taking sling off arm. Mitts places on pt and keeps pulling them off. MD Imogene Burnhen notified. Fluids discontinued and order for haldol will be administered.

## 2017-07-21 NOTE — Progress Notes (Signed)
Pt off unit for CT

## 2017-07-21 NOTE — ED Triage Notes (Signed)
[facility] pain and left shoulder pain secondary to an unwitnessed fall. Pt is alert, history of dementia complaining of left hip pain.

## 2017-07-21 NOTE — ED Provider Notes (Signed)
[facility] Emergency Department Provider Note   ____________________________________________   First MD Initiated Contact with Patient 07/21/17 [phone]     (approximate)  I have reviewed the triage vital signs and the nursing notes.   HISTORY  Chief Complaint Fall; Shoulder Pain; and Hip Pain  Patient with a history of dementia and unable to answer questions.  HPI Denise Cantu is a 84 y.o. female who comes into the hospital today after a fall at her nursing home.  The patient was sitting in a wheelchair by herself and was found on the floor by staff.  The patient is unable to answer questions as to what happened and how she fell tonight.  The patient has some left shoulder deformity and is complaining of left hip pain.  We are unsure if she hit her head but the patient states that she might have hit her head.   Past Medical History:  Diagnosis Date  . Alzheimer's dementia   . Carotid artery stenosis   . DDD (degenerative disc disease), cervical   . HTN (hypertension)   . Hyperlipemia   . Osteopenia   . PVD (peripheral vascular disease) (HCC)   . Vitamin D deficiency     Patient Active Problem List   Diagnosis Date Noted  . Humerus fracture 07/21/2017    Past Surgical History:  Procedure Laterality Date  . CORONARY ANGIOPLASTY      Prior to Admission medications   Medication Sig Start Date End Date Taking? Authorizing Provider  acetaminophen (TYLENOL) 500 MG tablet Take 1,000 mg by mouth 3 (three) times daily.   Yes [provider]  aspirin EC 81 MG tablet Take 81 mg by mouth daily.   Yes [provider]  atorvastatin (LIPITOR) 80 MG tablet Take 80 mg by mouth daily.   Yes [provider]  Calcium Carbonate-Vitamin D (CALCIUM 600+D) 600-400 MG-UNIT tablet Take 1 tablet by mouth daily.   Yes [provider]  cholecalciferol (VITAMIN D) 1000 units tablet Take 1,000 Units by mouth daily.   Yes [provider]  glucosamine-chondroitin 500-400 MG tablet Take 1 tablet by mouth daily.   Yes [provider]  hydrochlorothiazide (MICROZIDE) 12.5 MG capsule Take 12.5 mg by mouth daily.   Yes [provider]  isosorbide mononitrate (IMDUR) 30 MG 24 hr tablet Take 30 mg by mouth daily.   Yes [provider]  lidocaine (LIDODERM) 5 % Place 1 patch onto the skin daily. Remove & Discard patch within 12 hours or as directed by MD Apply to right hip   Yes [provider]  naproxen sodium (ALEVE) 220 MG tablet Take 220 mg by mouth 2 (two) times daily.   Yes [provider]  nitroGLYCERIN (NITROSTAT) 0.4 MG SL tablet Place 0.4 mg under the tongue every 5 (five) minutes as needed for chest pain.   Yes [provider]  traZODone (DESYREL) 50 MG tablet Take 25 mg by mouth at bedtime.   Yes [provider]    Allergies Patient has no known allergies.  Family History  Problem Relation Age of Onset  . CAD Mother   . CAD Sister   . CAD Brother     Social History Social History   Tobacco Use  . Smoking status: Never Smoker  . Smokeless tobacco: Never Used  Substance Use Topics  . Alcohol use: Never    Frequency: Never  . Drug use: Never    Review of Systems  Unable  to assess   ____________________________________________   PHYSICAL EXAM:  VITAL SIGNS: ED Triage Vitals  Enc Vitals Group     BP 07/21/17 0258 (!) 241/106     Pulse Rate 07/21/17 0258 90     Resp 07/21/17 0258 18     Temp 07/21/17 0258 99.2 F (37.3 C)     Temp Source 07/21/17 0258 Axillary     SpO2 07/21/17 0257 97 %     Weight 07/21/17 0302 175 lb (79.4 kg)     Height 07/21/17 0302 5\' 5"  (1.651 m)     Head Circumference --      Peak Flow --      Pain Score 07/21/17 0301 10     Pain Loc --      Pain Edu? --      Excl. in GC? --     Constitutional: Alert and not oriented, patient wont answer questions. Well appearing and in moderate  distress. Eyes: Conjunctivae are normal. PERRL. EOMI. Head: Atraumatic. Nose: No congestion/rhinnorhea. Mouth/Throat: Mucous membranes are moist.  Oropharynx non-erythematous. Neck: No cervical spine tenderness to palpation. Cardiovascular: Normal rate, regular rhythm. Grossly normal heart sounds.  Good peripheral circulation. Respiratory: Normal respiratory effort.  No retractions. Lungs CTAB. Gastrointestinal: Soft and nontender. No distention. Positive bowel sounds Musculoskeletal: left hip tenderness to palpation, left shoulder tenderness to palpation with deformity to left shoulder  Neurologic:  Normal speech and language.  Skin:  Skin is warm, dry and intact. Psychiatric: Mood and affect are normal.   ____________________________________________   LABS (all labs ordered are listed, but only abnormal results are displayed)  Labs Reviewed  CBC WITH DIFFERENTIAL/PLATELET - Abnormal; Notable for the following components:      Result Value   Neutro Abs 6.6 (*)    All other components within normal limits  COMPREHENSIVE METABOLIC PANEL - Abnormal; Notable for the following components:   Glucose, Bld 116 (*)    BUN 29 (*)    GFR calc non Af Amer 51 (*)    GFR calc Af Amer 59 (*)    All other components within normal limits  TROPONIN I   ____________________________________________  EKG  ED ECG REPORT I, Rebecka Apley, the attending physician, personally viewed and interpreted this ECG.   Date: 07/21/2017  EKG Time: 303  Rate: 94  Rhythm: normal sinus rhythm  Axis: normal  Intervals:right bundle branch block and left anterior fascicular block  ST&T Change: none  ____________________________________________  RADIOLOGY  ED MD interpretation:  CT head and cervical spine: No acute intracranial abnormality or calvarial fracture, moderate chronic microvascular  Ischemic changes and parenchymal volume loss of the brain, expansile opacification of the sphenoid sinus  with calcifications eroding into the clivus, no acute fracture or dislocation.  Official radiology report(s): Ct Head Wo Contrast  Result Date: 07/21/2017 CLINICAL DATA:  84 y/o  F; unwitnessed fall, history of dementia. EXAM: CT HEAD WITHOUT CONTRAST CT CERVICAL SPINE WITHOUT CONTRAST TECHNIQUE: Multidetector CT imaging of the head and cervical spine was performed following the standard protocol without intravenous contrast. Multiplanar CT image reconstructions of the cervical spine were also generated. COMPARISON:  07/17/2017 CT head FINDINGS: CT HEAD FINDINGS Brain: No evidence of acute infarction, hemorrhage, hydrocephalus, extra-axial collection or mass lesion/mass effect. Nonspecific foci of hypoattenuation within subcortical and periventricular white matter is compatible with moderate chronic microvascular ischemic changes and there is moderate brain parenchymal volume loss. Vascular: Calcific atherosclerosis of the carotid siphons. No hyperdense vessel. Skull: Normal.  Negative for fracture or focal lesion. Sinuses/Orbits: Opacification of the sphenoid sinus with expansile soft tissue attenuation, amorphous calcifications, and erosion into the clivus. Otherwise normal aeration of paranasal sinuses. Normal aeration of mastoid air cells. Bilateral intra-ocular lens replacement. Other: None. CT CERVICAL SPINE FINDINGS Alignment: Straightening of cervical lordosis. C7-T1 grade 1 anterolisthesis. Skull base and vertebrae: No acute fracture. No primary bone lesion or focal pathologic process. Soft tissues and spinal canal: No prevertebral fluid or swelling. No visible canal hematoma. Disc levels: Moderate cervical spondylosis with multilevel disc and facet degenerative changes. C3-C7 uncovertebral and facet hypertrophy results in bony foraminal stenosis. Multilevel mild to moderate stenosis greatest at the C5-6 level. Upper chest: Right lung apex pleuroparenchymal scarring. Other: Left lobe of thyroid nodule  measuring up to 23 mm. Calcific atherosclerosis of the carotid siphons. IMPRESSION: CT head: 1. No acute intracranial abnormality or calvarial fracture identified. 2. Moderate chronic microvascular ischemic changes and parenchymal volume loss of the brain. 3. Expansile opacification of the sphenoid sinus with calcifications eroding into the clivus. Findings probably represent a large sinus polyp or mucocele, less likely neoplasm arising from clivus. Nonemergent skull base MRI with and without contrast. CT cervical spine: 1. No acute fracture or dislocation identified. 2. 23 mm nodule within left lobe of thyroid. Nonemergent thyroid ultrasound is recommended. 3. Moderate spondylosis of the cervical spine. Electronically Signed   By: Mitzi Hansen M.D.   On: 07/21/2017 04:24   Ct Cervical Spine Wo Contrast  Result Date: 07/21/2017 CLINICAL DATA:  84 y/o  F; unwitnessed fall, history of dementia. EXAM: CT HEAD WITHOUT CONTRAST CT CERVICAL SPINE WITHOUT CONTRAST TECHNIQUE: Multidetector CT imaging of the head and cervical spine was performed following the standard protocol without intravenous contrast. Multiplanar CT image reconstructions of the cervical spine were also generated. COMPARISON:  07/17/2017 CT head FINDINGS: CT HEAD FINDINGS Brain: No evidence of acute infarction, hemorrhage, hydrocephalus, extra-axial collection or mass lesion/mass effect. Nonspecific foci of hypoattenuation within subcortical and periventricular white matter is compatible with moderate chronic microvascular ischemic changes and there is moderate brain parenchymal volume loss. Vascular: Calcific atherosclerosis of the carotid siphons. No hyperdense vessel. Skull: Normal. Negative for fracture or focal lesion. Sinuses/Orbits: Opacification of the sphenoid sinus with expansile soft tissue attenuation, amorphous calcifications, and erosion into the clivus. Otherwise normal aeration of paranasal sinuses. Normal aeration of  mastoid air cells. Bilateral intra-ocular lens replacement. Other: None. CT CERVICAL SPINE FINDINGS Alignment: Straightening of cervical lordosis. C7-T1 grade 1 anterolisthesis. Skull base and vertebrae: No acute fracture. No primary bone lesion or focal pathologic process. Soft tissues and spinal canal: No prevertebral fluid or swelling. No visible canal hematoma. Disc levels: Moderate cervical spondylosis with multilevel disc and facet degenerative changes. C3-C7 uncovertebral and facet hypertrophy results in bony foraminal stenosis. Multilevel mild to moderate stenosis greatest at the C5-6 level. Upper chest: Right lung apex pleuroparenchymal scarring. Other: Left lobe of thyroid nodule measuring up to 23 mm. Calcific atherosclerosis of the carotid siphons. IMPRESSION: CT head: 1. No acute intracranial abnormality or calvarial fracture identified. 2. Moderate chronic microvascular ischemic changes and parenchymal volume loss of the brain. 3. Expansile opacification of the sphenoid sinus with calcifications eroding into the clivus. Findings probably represent a large sinus polyp or mucocele, less likely neoplasm arising from clivus. Nonemergent skull base MRI with and without contrast. CT cervical spine: 1. No acute fracture or dislocation identified. 2. 23 mm nodule within left lobe of thyroid. Nonemergent thyroid ultrasound is recommended. 3. Moderate spondylosis  of the cervical spine. Electronically Signed   By: Mitzi Hansen M.D.   On: 07/21/2017 04:24   Dg Shoulder Left  Result Date: 07/21/2017 CLINICAL DATA:  Left shoulder pain after unwitnessed fall. EXAM: LEFT SHOULDER - 2+ VIEW COMPARISON:  None. FINDINGS: Significantly displaced fracture through the surgical neck of the humerus with greater than 1 shaft with anterior displacement of the humeral shaft. Glenohumeral alignment is difficult to assess, humeral head likely remains articulating with the glenoid. The acromioclavicular joint is  congruent. IMPRESSION: Significantly displaced fracture through the surgical neck of the humerus. Glenohumeral alignment is difficult to assess but likely congruent. Electronically Signed   By: Rubye Oaks M.D.   On: 07/21/2017 04:05   Dg Hip Unilat With Pelvis 2-3 Views Left  Result Date: 07/21/2017 CLINICAL DATA:  84 y/o F; left hip and shoulder pain with unwitnessed fall. EXAM: DG HIP (WITH OR WITHOUT PELVIS) 2-3V LEFT COMPARISON:  07/17/2017 pelvis and left lower extremity radiographs. FINDINGS: There is no evidence of hip fracture or dislocation. Mild osteoarthrosis of the hip joints with loss of joint space and osteophytosis. Vascular calcifications noted. Lower lumbar spondylosis partially visualized. Bones are demineralized. IMPRESSION: No acute bony or articular abnormality identified. Electronically Signed   By: Mitzi Hansen M.D.   On: 07/21/2017 04:04    ____________________________________________   PROCEDURES  Procedure(s) performed: None  Procedures  Critical Care performed: No  ____________________________________________   INITIAL IMPRESSION / ASSESSMENT AND PLAN / ED COURSE  As part of my medical decision making, I reviewed the following data within the electronic MEDICAL RECORD NUMBER Notes from prior ED visits and [place] Controlled Substance Database   This is a 84 year old female who comes into the hospital today after a fall out of a wheelchair at her nursing home.  The patient is complaining of some left hip pain but also has some left shoulder deformity.  My differential diagnosis includes fracture versus musculoskeletal pain.  We did check some blood work on the patient to include a CBC, CMP and a troponin.  We also sent the patient for a CT scan of her head and cervical spine as well as an x-ray of her shoulder and hip.  The patient CT head and cervical spine are unremarkable.  The patient's hip x-ray is negative.  The patient's shoulder x-ray shows a  significantly displaced fracture through the surgical neck of the humerus, glenohumeral joint is difficult to assess but likely congruent.  The patient did receive a dose of fentanyl for pain and it was sleeping comfortably.  I contacted orthopedics Dr. Joice Lofts who felt that given the patient's age and mental status she would not be appropriate for surgery.  I will admit the patient to the hospitalist service for pain control.      ____________________________________________   FINAL CLINICAL IMPRESSION(S) / ED DIAGNOSES  Final diagnoses:  Closed displaced fracture of surgical neck of left humerus, unspecified fracture morphology, initial encounter     ED Discharge Orders    None       Note:  This document was prepared using Dragon voice recognition software and may include unintentional dictation errors.    Rebecka Apley, MD 07/21/17 [phone]

## 2017-07-21 NOTE — Clinical Social Work Note (Addendum)
Clinical Social Work Assessment  Patient Details  Name: Denise Cantu MRN: [id number] Date of Birth: 01/13/1920  Date of referral:  07/21/17               Reason for consult:  Facility Placement, Discharge Planning                Permission sought to share information with:  Facility Contact Representative Permission granted to share information::  Yes, Verbal Permission Granted  Name::        Agency::     Relationship::     Contact Information:     Housing/Transportation Living arrangements for the past 2 months:  Assisted Living Facility Source of Information:  Patient, Facility Patient Interpreter Needed:  None Criminal Activity/Legal Involvement Pertinent to Current Situation/Hospitalization:  No - Comment as needed Significant Relationships:  Adult Children Lives with:  Facility Resident Do you feel safe going back to the place where you live?  Yes Need for family participation in patient care:  Yes (Comment)  Care giving concerns: Patient has been a resident at [facility] for 1 week (fax: [phone]).   Social Worker assessment / plan: Clinical Social Worker (CSW) received consult that patient is from [facility]. Social work intern met with patient alone at bedside. Patient was sitting up in bed, eating breakfast alert and oriented to self only. Social work intern introduced self and explained the role of the CSW department. Patient could not remember where she lives but knows she uses a wheelchair. Social work intern attempted to contact patient's son Walter ([phone]) to complete assessment because of patient's confusion and a voicemail was left.  CSW contacted [facility] and spoke to Wellness Director Angela. Per Angela patient has been at [facility] on the ALF side for 1 week. Per Angela patient is primarily in a wheel chair and needs assistance with transfers. Per Angela patient can hold on to rails and roll to assist with transfers and ADLs. Per  Angela patient is on room air at baseline. Angela reported that patient is open to home health PT but can't remember the agency. Per Angela patient can return to [facility] via EMS or son can transport. CSW will continue to follow and assist as needed.   Employment status:  Retired Insurance information:  Medicare PT Recommendations:  Not assessed at this time Information / Referral to community resources:     Patient/Family's Response to care: Patient will return to [facility] once stable for discharge.  Patient/Family's Understanding of and Emotional Response to Diagnosis, Current Treatment, and Prognosis: Patient was pleasant and will thanked social work intern for her assistance.  Emotional Assessment Appearance:  Appears stated age Attitude/Demeanor/Rapport:    Affect (typically observed):  Pleasant, Restless, Accepting Orientation:  Oriented to Self Alcohol / Substance use:  Not Applicable Psych involvement (Current and /or in the community):  No (Comment)  Discharge Needs  Concerns to be addressed:  Care Coordination, Discharge Planning Concerns Readmission within the last 30 days:  No Current discharge risk:  Dependent with Mobility, Cognitively Impaired Barriers to Discharge:  Continued Medical Work up   Ananda A Hodgson, Student-Social Work 07/21/2017, 9:57 AM  

## 2017-07-21 NOTE — [place] FL2 (Signed)
[place] MEDICAID FL2 LEVEL OF CARE SCREENING TOOL     IDENTIFICATION  Patient Name: Lowen Swazil[place]Jordan Birthdate: Aug 16, 1919 Sex: female Admission Date (Current Location): 07/21/2017  [place]   Facility and Address:  [facility], [address], [place], [place] [address]      Provider Number: [id number]  Attending Physician Name and Address:  Shaune Pollackhen, Qing, MD  Relative Name and Phone Number:       Current Level of Care: Hospital Recommended Level of Care: Assisted Living Facility([facility]) Prior Approval Number:    Date Approved/Denied:   PASRR Number:    Discharge Plan: Domiciliary (Rest home)([facility])    Current Diagnoses: Patient Active Problem List   Diagnosis Date Noted  . Humerus fracture 07/21/2017    Orientation RESPIRATION BLADDER Height & Weight     Self  Normal Continent Weight: 175 lb (79.4 kg) Height:  5\' 5"  (165.1 cm)  BEHAVIORAL SYMPTOMS/MOOD NEUROLOGICAL BOWEL NUTRITION STATUS      Continent Diet(Regular)  AMBULATORY STATUS COMMUNICATION OF NEEDS Skin   Extensive Assist Verbally Normal                       Personal Care Assistance Level of Assistance  Bathing, Feeding, Dressing Bathing Assistance: Limited assistance Feeding assistance: Independent Dressing Assistance: Limited assistance     Functional Limitations Info  Sight, Hearing, Speech Sight Info: Adequate Hearing Info: Adequate Speech Info: Adequate    SPECIAL CARE FACTORS FREQUENCY  PT (By licensed PT), OT (By licensed OT)     PT Frequency: (2-3 Home Health) OT Frequency: (2-3 Home Health)            Contractures      Additional Factors Info  Code Status, Allergies Code Status Info: (DNR) Allergies Info: (No Known Allergies)           Current Medications (07/21/2017):  This is the current hospital active medication list Current Facility-Administered Medications  Medication Dose Route  Frequency Provider Last Rate Last Dose  . 0.9 %  sodium chloride infusion   Intravenous Continuous Shaune Pollackhen, Qing, MD 50 mL/hr at 07/21/17 0908    . acetaminophen (TYLENOL) tablet 650 mg  650 mg Oral Q6H PRN Shaune Pollackhen, Qing, MD       Or  . acetaminophen (TYLENOL) suppository 650 mg  650 mg Rectal Q6H PRN Shaune Pollackhen, Qing, MD      . albuterol (PROVENTIL) (2.5 MG/3ML) 0.083% nebulizer solution 2.5 mg  2.5 mg Nebulization Q2H PRN Shaune Pollackhen, Qing, MD      . aspirin EC tablet 81 mg  81 mg Oral Daily Shaune Pollackhen, Qing, MD      . atorvastatin (LIPITOR) tablet 80 mg  80 mg Oral Daily Shaune Pollackhen, Qing, MD      . bisacodyl (DULCOLAX) EC tablet 5 mg  5 mg Oral Daily PRN Shaune Pollackhen, Qing, MD      . Melene Muller[START ON 07/22/2017] calcium-vitamin D (OSCAL WITH D) 500-200 MG-UNIT per tablet 1 tablet  1 tablet Oral Q breakfast Shaune Pollackhen, Qing, MD      . cholecalciferol (VITAMIN D) tablet 1,000 Units  1,000 Units Oral Daily Shaune Pollackhen, Qing, MD      . docusate sodium (COLACE) capsule 100 mg  100 mg Oral BID Shaune Pollackhen, Qing, MD      . enoxaparin (LOVENOX) injection 40 mg  40 mg Subcutaneous Q24H Shaune Pollackhen, Qing, MD      . hydrALAZINE (APRESOLINE) injection 10 mg  10 mg  Intravenous Q6H PRN Shaune Pollack, MD   10 mg at 07/21/17 0751  . HYDROcodone-acetaminophen (NORCO/VICODIN) 5-325 MG per tablet 1-2 tablet  1-2 tablet Oral Q4H PRN Shaune Pollack, MD      . isosorbide mononitrate (IMDUR) 24 hr tablet 30 mg  30 mg Oral Daily Shaune Pollack, MD      . ketorolac (TORADOL) 15 MG/ML injection 15 mg  15 mg Intravenous Q6H PRN Shaune Pollack, MD      . naproxen (NAPROSYN) tablet 250 mg  250 mg Oral BID WC Shaune Pollack, MD      . nitroGLYCERIN (NITROSTAT) SL tablet 0.4 mg  0.4 mg Sublingual Q5 min PRN Shaune Pollack, MD      . ondansetron [facility]) tablet 4 mg  4 mg Oral Q6H PRN Shaune Pollack, MD       Or  . ondansetron [facility]) injection 4 mg  4 mg Intravenous Q6H PRN Shaune Pollack, MD      . senna-docusate (Senokot-S) tablet 1 tablet  1 tablet Oral QHS PRN Shaune Pollack, MD      . traZODone (DESYREL) tablet 25 mg  25  mg Oral QHS Shaune Pollack, MD         Discharge Medications: Please see discharge summary for a list of discharge medications.  Relevant Imaging Results:  Relevant Lab Results:   Additional Information (SSN: [id number])  Payton Spark, Student-Social Work

## 2017-07-21 NOTE — Care Management (Signed)
RNCM spoke with patient's son Denise Cantu by phone [phone] to explain MOON letter.  He states his mother/patient has been at [facility] now [facility]. She is primarily wheelchair dependent due to dementia. She is retired from [employer] and her primary care is through [employer] however he is trying to arrange a physician that does house calls at facility.  She is supposed to start PT at facility per son.  Orders will be needed for PT/OT at discharge/

## 2017-07-21 NOTE — ED Notes (Signed)
Pt went to x-ray and CT.

## 2017-07-21 NOTE — H&P (Signed)
[facility] - [place] [facility]   PATIENT NAME: Denise Cantu    MR#:  [id number]  DATE OF BIRTH:  1919/12/23  DATE OF ADMISSION:  07/21/2017  PRIMARY CARE PHYSICIAN: Patient, No Pcp Per   REQUESTING/REFERRING PHYSICIAN: Dr. Zenda Alpers  CHIEF COMPLAINT:   Chief Complaint  Patient presents with  . Fall  . Shoulder Pain  . Hip Pain   Fall with left shoulder pain and hip pain today HISTORY OF PRESENT ILLNESS:  Denise Cantu  is a 84 y.o. female with a known history of   PAST MEDICAL HISTORY:   Past Medical History:  Diagnosis Date  . Alzheimer's dementia   . Carotid artery stenosis   . DDD (degenerative disc disease), cervical   . HTN (hypertension)   . Hyperlipemia   . Osteopenia   . PVD (peripheral vascular disease) (HCC)   . Vitamin D deficiency     PAST SURGICAL HISTORY:   Past Surgical History:  Procedure Laterality Date  . CORONARY ANGIOPLASTY      SOCIAL HISTORY:   Social History   Tobacco Use  . Smoking status: Never Smoker  . Smokeless tobacco: Never Used  Substance Use Topics  . Alcohol use: Never    Frequency: Never    FAMILY HISTORY:   Family History  Problem Relation Age of Onset  . CAD Mother   . CAD Sister   . CAD Brother     DRUG ALLERGIES:  No Known Allergies  REVIEW OF SYSTEMS:   Review of Systems  Unable to perform ROS: Dementia    MEDICATIONS AT HOME:   Prior to Admission medications   Medication Sig Start Date End Date Taking? Authorizing Provider  acetaminophen (TYLENOL) 500 MG tablet Take 1,000 mg by mouth 3 (three) times daily.   Yes [provider]  aspirin EC 81 MG tablet Take 81 mg by mouth daily.   Yes [provider]  atorvastatin (LIPITOR) 80 MG tablet Take 80 mg by mouth daily.   Yes [provider]  Calcium Carbonate-Vitamin D (CALCIUM 600+D) 600-400 MG-UNIT tablet Take 1 tablet by mouth daily.   Yes [provider]  cholecalciferol (VITAMIN D) 1000 units  tablet Take 1,000 Units by mouth daily.   Yes [provider]  glucosamine-chondroitin 500-400 MG tablet Take 1 tablet by mouth daily.   Yes [provider]  hydrochlorothiazide (MICROZIDE) 12.5 MG capsule Take 12.5 mg by mouth daily.   Yes [provider]  isosorbide mononitrate (IMDUR) 30 MG 24 hr tablet Take 30 mg by mouth daily.   Yes [provider]  lidocaine (LIDODERM) 5 % Place 1 patch onto the skin daily. Remove & Discard patch within 12 hours or as directed by MD Apply to right hip   Yes [provider]  naproxen sodium (ALEVE) 220 MG tablet Take 220 mg by mouth 2 (two) times daily.   Yes [provider]  nitroGLYCERIN (NITROSTAT) 0.4 MG SL tablet Place 0.4 mg under the tongue every 5 (five) minutes as needed for chest pain.   Yes [provider]  traZODone (DESYREL) 50 MG tablet Take 25 mg by mouth at bedtime.   Yes [provider]      VITAL SIGNS:  Blood pressure (!) 169/91, pulse 69, temperature 99.2 F (37.3 C), temperature source Axillary, resp. rate 18, height 5\' 5"  (1.651 m), weight 175 lb (79.4 kg), SpO2 94 %.  PHYSICAL EXAMINATION:  Physical Exam  Constitutional: No distress.  HENT:  Head: Normocephalic.  Mouth/Throat: Oropharynx is clear and moist.  Eyes: Pupils are equal, round, and reactive to light. Conjunctivae and EOM are normal. No scleral icterus.  Neck: Normal range of motion. Neck supple. No JVD present. No tracheal deviation present.  Cardiovascular: Normal rate, regular rhythm and normal heart sounds. Exam reveals no gallop.  No murmur heard. Pulmonary/Chest: Effort normal and breath sounds normal. No respiratory distress. She has no wheezes. She has no rales.  Abdominal: Soft. Bowel sounds are normal. She exhibits no distension. There is no tenderness. There is no rebound.  Musculoskeletal: She exhibits no edema or tenderness.  Unable to move her left arm and shoulder, swelling and  tenderness on left shoulder.  Neurological: No cranial nerve deficit.  Demented.  Skin: No rash noted. No erythema.  Bruises on legs.    LABORATORY PANEL:   CBC Recent Labs  Lab 07/21/17 0358  WBC 9.9  HGB 13.3  HCT 40.2  PLT 250   ------------------------------------------------------------------------------------------------------------------  Chemistries  Recent Labs  Lab 07/21/17 0358  NA 137  K 3.7  CL 103  CO2 27  GLUCOSE 116*  BUN 29*  CREATININE 0.92  CALCIUM 9.1  AST 21  ALT 15  ALKPHOS 85  BILITOT 0.7   ------------------------------------------------------------------------------------------------------------------  Cardiac Enzymes Recent Labs  Lab 07/21/17 0358  TROPONINI <0.03   ------------------------------------------------------------------------------------------------------------------  RADIOLOGY:  Ct Head Wo Contrast  Result Date: 07/21/2017 CLINICAL DATA:  84 y/o  F; unwitnessed fall, history of dementia. EXAM: CT HEAD WITHOUT CONTRAST CT CERVICAL SPINE WITHOUT CONTRAST TECHNIQUE: Multidetector CT imaging of the head and cervical spine was performed following the standard protocol without intravenous contrast. Multiplanar CT image reconstructions of the cervical spine were also generated. COMPARISON:  07/17/2017 CT head FINDINGS: CT HEAD FINDINGS Brain: No evidence of acute infarction, hemorrhage, hydrocephalus, extra-axial collection or mass lesion/mass effect. Nonspecific foci of hypoattenuation within subcortical and periventricular white matter is compatible with moderate chronic microvascular ischemic changes and there is moderate brain parenchymal volume loss. Vascular: Calcific atherosclerosis of the carotid siphons. No hyperdense vessel. Skull: Normal. Negative for fracture or focal lesion. Sinuses/Orbits: Opacification of the sphenoid sinus with expansile soft tissue attenuation, amorphous calcifications, and erosion into the clivus.  Otherwise normal aeration of paranasal sinuses. Normal aeration of mastoid air cells. Bilateral intra-ocular lens replacement. Other: None. CT CERVICAL SPINE FINDINGS Alignment: Straightening of cervical lordosis. C7-T1 grade 1 anterolisthesis. Skull base and vertebrae: No acute fracture. No primary bone lesion or focal pathologic process. Soft tissues and spinal canal: No prevertebral fluid or swelling. No visible canal hematoma. Disc levels: Moderate cervical spondylosis with multilevel disc and facet degenerative changes. C3-C7 uncovertebral and facet hypertrophy results in bony foraminal stenosis. Multilevel mild to moderate stenosis greatest at the C5-6 level. Upper chest: Right lung apex pleuroparenchymal scarring. Other: Left lobe of thyroid nodule measuring up to 23 mm. Calcific atherosclerosis of the carotid siphons. IMPRESSION: CT head: 1. No acute intracranial abnormality or calvarial fracture identified. 2. Moderate chronic microvascular ischemic changes and parenchymal volume loss of the brain. 3. Expansile opacification of the sphenoid sinus with calcifications eroding into the clivus. Findings probably represent a large sinus polyp or mucocele, less likely neoplasm arising from clivus. Nonemergent skull base MRI with and without contrast. CT cervical spine: 1. No acute fracture or dislocation identified. 2. 23 mm nodule within left lobe of thyroid. Nonemergent thyroid ultrasound is recommended. 3. Moderate spondylosis of the cervical spine. Electronically Signed   By: Buzzy HanLance  Furusawa-Stratton M.D.  On: 07/21/2017 04:24   Ct Cervical Spine Wo Contrast  Result Date: 07/21/2017 CLINICAL DATA:  84 y/o  F; unwitnessed fall, history of dementia. EXAM: CT HEAD WITHOUT CONTRAST CT CERVICAL SPINE WITHOUT CONTRAST TECHNIQUE: Multidetector CT imaging of the head and cervical spine was performed following the standard protocol without intravenous contrast. Multiplanar CT image reconstructions of the cervical  spine were also generated. COMPARISON:  07/17/2017 CT head FINDINGS: CT HEAD FINDINGS Brain: No evidence of acute infarction, hemorrhage, hydrocephalus, extra-axial collection or mass lesion/mass effect. Nonspecific foci of hypoattenuation within subcortical and periventricular white matter is compatible with moderate chronic microvascular ischemic changes and there is moderate brain parenchymal volume loss. Vascular: Calcific atherosclerosis of the carotid siphons. No hyperdense vessel. Skull: Normal. Negative for fracture or focal lesion. Sinuses/Orbits: Opacification of the sphenoid sinus with expansile soft tissue attenuation, amorphous calcifications, and erosion into the clivus. Otherwise normal aeration of paranasal sinuses. Normal aeration of mastoid air cells. Bilateral intra-ocular lens replacement. Other: None. CT CERVICAL SPINE FINDINGS Alignment: Straightening of cervical lordosis. C7-T1 grade 1 anterolisthesis. Skull base and vertebrae: No acute fracture. No primary bone lesion or focal pathologic process. Soft tissues and spinal canal: No prevertebral fluid or swelling. No visible canal hematoma. Disc levels: Moderate cervical spondylosis with multilevel disc and facet degenerative changes. C3-C7 uncovertebral and facet hypertrophy results in bony foraminal stenosis. Multilevel mild to moderate stenosis greatest at the C5-6 level. Upper chest: Right lung apex pleuroparenchymal scarring. Other: Left lobe of thyroid nodule measuring up to 23 mm. Calcific atherosclerosis of the carotid siphons. IMPRESSION: CT head: 1. No acute intracranial abnormality or calvarial fracture identified. 2. Moderate chronic microvascular ischemic changes and parenchymal volume loss of the brain. 3. Expansile opacification of the sphenoid sinus with calcifications eroding into the clivus. Findings probably represent a large sinus polyp or mucocele, less likely neoplasm arising from clivus. Nonemergent skull base MRI with and  without contrast. CT cervical spine: 1. No acute fracture or dislocation identified. 2. 23 mm nodule within left lobe of thyroid. Nonemergent thyroid ultrasound is recommended. 3. Moderate spondylosis of the cervical spine. Electronically Signed   By: Mitzi Hansen M.D.   On: 07/21/2017 04:24   Dg Shoulder Left  Result Date: 07/21/2017 CLINICAL DATA:  Left shoulder pain after unwitnessed fall. EXAM: LEFT SHOULDER - 2+ VIEW COMPARISON:  None. FINDINGS: Significantly displaced fracture through the surgical neck of the humerus with greater than 1 shaft with anterior displacement of the humeral shaft. Glenohumeral alignment is difficult to assess, humeral head likely remains articulating with the glenoid. The acromioclavicular joint is congruent. IMPRESSION: Significantly displaced fracture through the surgical neck of the humerus. Glenohumeral alignment is difficult to assess but likely congruent. Electronically Signed   By: Rubye Oaks M.D.   On: 07/21/2017 04:05   Dg Hip Unilat With Pelvis 2-3 Views Left  Result Date: 07/21/2017 CLINICAL DATA:  84 y/o F; left hip and shoulder pain with unwitnessed fall. EXAM: DG HIP (WITH OR WITHOUT PELVIS) 2-3V LEFT COMPARISON:  07/17/2017 pelvis and left lower extremity radiographs. FINDINGS: There is no evidence of hip fracture or dislocation. Mild osteoarthrosis of the hip joints with loss of joint space and osteophytosis. Vascular calcifications noted. Lower lumbar spondylosis partially visualized. Bones are demineralized. IMPRESSION: No acute bony or articular abnormality identified. Electronically Signed   By: Mitzi Hansen M.D.   On: 07/21/2017 04:04      IMPRESSION AND PLAN:   Left humerus dislocated fracture The patient will be placed for  observation.  Follow-up orthopedic surgeon for further recommendation. Pain control.  Dehydration.  Normal saline IV.  Hold HCTZ.  Alzheimer's dementia Aspiration the fall  precaution.  Osteopenia.  Continue calcium and vitamin D.  Hypertension.  Continue hypertension medication. I called the patient's son Mr. Walter Cantu, nobody answered the phone. All the records are reviewed and case discussed with ED provider. Management plans discussed with the patient, family and they are in agreement.  CODE STATUS: DNR  TOTAL TIME TAKING CARE OF THIS PATIENT: 46 minutes.    Shaune Pollack M.D on 07/21/2017 at 7:35 AM  Between 7am to 6pm - Pager - [phone]  After 6pm go to [email or web address] - Social research officer, government  [facility]  Office  [phone]  CC: Primary care physician; Patient, No Pcp Per   Note: This dictation was prepared with Dragon dictation along with smaller phrase technology. Any transcriptional errors that result from this process are unin

## 2017-07-21 NOTE — Care Management Obs Status (Signed)
MEDICARE OBSERVATION STATUS NOTIFICATION   Patient Details  Name: Denise Cantu MRN: [id number] Date of Birth: 1919/05/03   Medicare Observation Status Notification Given:  Yes    Collie Siadngela Aser Nylund, RN 07/21/2017, 8:35 AM

## 2017-07-22 DIAGNOSIS — S42212A Unspecified displaced fracture of surgical neck of left humerus, initial encounter for closed fracture: Secondary | ICD-10-CM | POA: Diagnosis not present

## 2017-07-22 LAB — CBC
HEMATOCRIT: 41.8 % (ref 35.0–47.0)
Hemoglobin: 13.6 g/dL (ref 12.0–16.0)
MCH: 30.4 pg (ref 26.0–34.0)
MCHC: 32.5 g/dL (ref 32.0–36.0)
MCV: 93.4 fL (ref 80.0–100.0)
Platelets: 286 10*3/uL (ref 150–440)
RBC: 4.48 MIL/uL (ref 3.80–5.20)
RDW: 13.2 % (ref 11.5–14.5)
WBC: 12.9 10*3/uL — ABNORMAL HIGH (ref 3.6–11.0)

## 2017-07-22 LAB — BASIC METABOLIC PANEL
Anion gap: 11 (ref 5–15)
BUN: 22 mg/dL — AB (ref 6–20)
CO2: 26 mmol/L (ref 22–32)
Calcium: 9.1 mg/dL (ref 8.9–10.3)
Chloride: 102 mmol/L (ref 101–111)
Creatinine, Ser: 0.66 mg/dL (ref 0.44–1.00)
GFR calc Af Amer: >60 mL/min (ref >60)
GLUCOSE: 118 mg/dL — AB (ref 65–99)
POTASSIUM: 3.3 mmol/L — AB (ref 3.5–5.1)
Sodium: 139 mmol/L (ref 135–145)

## 2017-07-22 MED ORDER — POTASSIUM CHLORIDE CRYS ER 20 MEQ PO TBCR
40.0000 meq | EXTENDED_RELEASE_TABLET | Freq: Once | ORAL | Status: AC
  Administered 2017-07-22: 40 meq via ORAL
  Filled 2017-07-22: qty 2

## 2017-07-22 MED ORDER — OXYCODONE HCL 5 MG PO TABS: 5.0000 mg | ORAL_TABLET | ORAL | 0 refills | Status: DC | PRN

## 2017-07-22 MED ORDER — SENNOSIDES-DOCUSATE SODIUM 8.6-50 MG PO TABS: 1.0000 | ORAL_TABLET | Freq: Every evening | ORAL | Status: DC | PRN

## 2017-07-22 MED ORDER — OXYCODONE HCL 5 MG PO TABS: 5.0000 mg | ORAL_TABLET | ORAL | Status: DC | PRN

## 2017-07-22 MED ORDER — DOCUSATE SODIUM 100 MG PO CAPS: 100.0000 mg | ORAL_CAPSULE | Freq: Two times a day (BID) | ORAL | 0 refills | Status: DC

## 2017-07-22 MED ORDER — ALBUTEROL SULFATE (2.5 MG/3ML) 0.083% IN NEBU: 2.5000 mg | INHALATION_SOLUTION | Freq: Four times a day (QID) | RESPIRATORY_TRACT | 12 refills | Status: DC | PRN

## 2017-07-22 NOTE — Care Management (Signed)
Requested PT/OT orders from attending

## 2017-07-22 NOTE — Discharge Instructions (Signed)
Follow-up with the primary care physician at the facility in 3-4 days Follow-up with orthopedics in 2-3 weeks

## 2017-07-22 NOTE — Discharge Summary (Signed)
[facility] - [place] at [facility]   PATIENT NAME: Denise Cantu    MR#:  [id number]  DATE OF BIRTH:  09-21-1919  DATE OF ADMISSION:  07/21/2017 ADMITTING PHYSICIAN: Shaune Pollack, MD  DATE OF DISCHARGE:  07/22/17  PRIMARY CARE PHYSICIAN: Patient, No Pcp Per    ADMISSION DIAGNOSIS:  Closed displaced fracture of surgical neck of left humerus, unspecified fracture morphology, initial encounter [S42.212A]  DISCHARGE DIAGNOSIS:  Active Problems:   Humerus fracture   SECONDARY DIAGNOSIS:   Past Medical History:  Diagnosis Date  . Alzheimer's dementia   . Carotid artery stenosis   . DDD (degenerative disc disease), cervical   . HTN (hypertension)   . Hyperlipemia   . Osteopenia   . PVD (peripheral vascular disease) (HCC)   . Vitamin D deficiency     HOSPITAL COURSE:  hpi  Denise Cantu  is a 84 y.o. female with a known history of Alzheimer's disease, carotid artery stenosis, hypertension, hyperlipidemia and peripheral vascular disease is brought into the ED with a chief complaint of left shoulder pain after she sustained a fall  Hospital course Left humerus dislocated fracture Patient was provided pain management and seen by orthopedics Dr. Joice Lofts, who has recommended shoulder immobilizer and should give a chance to heal by itself.  Pain management in the interim and outpatient follow-up with orthopedics in 2-3 weeks for repeat x-ray Follow-up orthopedic surgeon in 2-3 weeks outpatient for further recommendation. Pain control as needed  Dehydration.  Resolved with IV fluids Normal saline IV.    Resume HCTZ.  Alzheimer's dementia Aspiration the fall precaution.  Osteopenia.  Continue calcium and vitamin D.  Hypertension.  Continue hypertension medication.    DISCHARGE CONDITIONS:   fair  CONSULTS OBTAINED:  Treatment Team:  Signa Kell, MD Poggi, Excell Seltzer, MD   PROCEDURES  nonme  DRUG ALLERGIES:  No Known Allergies  DISCHARGE  MEDICATIONS:   Allergies as of 07/22/2017   No Known Allergies     Medication List    TAKE these medications   acetaminophen 500 MG tablet Commonly known as:  TYLENOL Take 1,000 mg by mouth 3 (three) times daily.   albuterol (2.5 MG/3ML) 0.083% nebulizer solution Commonly known as:  PROVENTIL Take 3 mLs (2.5 mg total) by nebulization every 6 (six) hours as needed for wheezing.   aspirin EC 81 MG tablet Take 81 mg by mouth daily.   atorvastatin 80 MG tablet Commonly known as:  LIPITOR Take 80 mg by mouth daily.   CALCIUM 600+D 600-400 MG-UNIT tablet Generic drug:  Calcium Carbonate-Vitamin D Take 1 tablet by mouth daily.   cholecalciferol 1000 units tablet Commonly known as:  VITAMIN D Take 1,000 Units by mouth daily.   docusate sodium 100 MG capsule Commonly known as:  COLACE Take 1 capsule (100 mg total) by mouth 2 (two) times daily.   glucosamine-chondroitin 500-400 MG tablet Take 1 tablet by mouth daily.   hydrochlorothiazide 12.5 MG capsule Commonly known as:  MICROZIDE Take 12.5 mg by mouth daily.   isosorbide mononitrate 30 MG 24 hr tablet Commonly known as:  IMDUR Take 30 mg by mouth daily.   lidocaine 5 % Commonly known as:  LIDODERM Place 1 patch onto the skin daily. Remove & Discard patch within 12 hours or as directed by MD Apply to right hip   naproxen sodium 220 MG tablet Commonly known as:  ALEVE Take 220 mg by mouth 2 (two) times daily.   nitroGLYCERIN 0.4 MG SL  tablet Commonly known as:  NITROSTAT Place 0.4 mg under the tongue every 5 (five) minutes as needed for chest pain.   oxyCODONE 5 MG immediate release tablet Commonly known as:  Oxy IR/ROXICODONE Take 1 tablet (5 mg total) by mouth every 4 (four) hours as needed for severe pain or breakthrough pain.   senna-docusate 8.6-50 MG tablet Commonly known as:  Senokot-S Take 1 tablet by mouth at bedtime as needed for mild constipation.   traZODone 50 MG tablet Commonly known as:   DESYREL Take 25 mg by mouth at bedtime.        DISCHARGE INSTRUCTIONS:   Follow-up with the primary care physician at the facility in 3-4 days Follow-up with orthopedics in 2-3 weeks  DIET:  cardiac  DISCHARGE CONDITION:  Fair  ACTIVITY:  Activity as tolerated  OXYGEN:  Home Oxygen: No.   Oxygen Delivery: room air  DISCHARGE LOCATION:  nursing home   If you experience worsening of your admission symptoms, develop shortness of breath, life threatening emergency, suicidal or homicidal thoughts you must seek medical attention immediately by calling 911 or calling your MD immediately  if symptoms less severe.  You Must read complete instructions/literature along with all the possible adverse reactions/side effects for all the Medicines you take and that have been prescribed to you. Take any new Medicines after you have completely understood and accpet all the possible adverse reactions/side effects.   Please note  You were cared for by a hospitalist during your hospital stay. If you have any questions about your discharge medications or the care you received while you were in the hospital after you are discharged, you can call the unit and asked to speak with the hospitalist on call if the hospitalist that took care of you is not available. Once you are discharged, your primary care physician will handle any further medical issues. Please note that NO REFILLS for any discharge medications will be authorized once you are discharged, as it is imperative that you return to your primary care physician (or establish a relationship with a primary care physician if you do not have one) for your aftercare needs so that they can reassess your need for medications and monitor your lab values.     Today  Chief Complaint  Patient presents with  . Fall  . Shoulder Pain  . Hip Pain   Patient is resting comfortably.  Pain seems to be well controlled okay to discharge patient from  orthopedic standpoint  Review of system unobtainable as the patient is demented   VITAL SIGNS:  Blood pressure (!) 148/61, pulse 87, temperature 98.7 F (37.1 C), temperature source Oral, resp. rate 17, height 5\' 5"  (1.651 m), weight 79.4 kg (175 lb), SpO2 99 %.  I/O:    Intake/Output Summary (Last 24 hours) at 07/22/2017 0931 Last data filed at 07/21/2017 1700 Gross per 24 hour  Intake 440.83 ml  Output -  Net 440.83 ml    PHYSICAL EXAMINATION:  GENERAL:  84 y.o.-year-old patient lying in the bed with no acute distress.  EYES: Pupils equal, round, reactive to light and accommodation. No scleral icterus. Extraocular muscles intact.  HEENT: Head atraumatic, normocephalic. Oropharynx and nasopharynx clear.  NECK:  Supple, no jugular venous distention. No thyroid enlargement, no tenderness.  LUNGS: Normal breath sounds bilaterally, no wheezing, rales,rhonchi or crepitation. No use of accessory muscles of respiration.  CARDIOVASCULAR: S1, S2 normal. No murmurs, rubs, or gallops.  ABDOMEN: Soft, non-tender, non-distended. Bowel sounds present. No  organomegaly or mass.  EXTREMITIES: Left shoulder in immobilizer no pedal edema, cyanosis, or clubbing.  NEUROLOGIC: Awake n alert and demented  pSYCHIATRIC: The patient is alert and disoriented SKIN: No obvious rash, lesion, or ulcer.   DATA REVIEW:   CBC Recent Labs  Lab 07/22/17 0450  WBC 12.9*  HGB 13.6  HCT 41.8  PLT 286    Chemistries  Recent Labs  Lab 07/21/17 0358 07/22/17 0450  NA 137 139  K 3.7 3.3*  CL 103 102  CO2 27 26  GLUCOSE 116* 118*  BUN 29* 22*  CREATININE 0.92 0.66  CALCIUM 9.1 9.1  AST 21  --   ALT 15  --   ALKPHOS 85  --   BILITOT 0.7  --     Cardiac Enzymes Recent Labs  Lab 07/21/17 0358  TROPONINI <0.03    Microbiology Results  Results for orders placed or performed during the hospital encounter of 07/21/17  MRSA PCR Screening     Status: None   Collection Time: 07/21/17 12:11 PM   Result Value Ref Range Status   MRSA by PCR NEGATIVE NEGATIVE Final    Comment:        The GeneXpert MRSA Assay (FDA approved for NASAL specimens only), is one component of a comprehensive MRSA colonization surveillance program. It is not intended to diagnose MRSA infection nor to guide or monitor treatment for MRSA infections. Performed at [facility], [address] [place], [place] [address]     RADIOLOGY:  Ct Head Wo Contrast  Result Date: 07/21/2017 CLINICAL DATA:  84 y/o  F; unwitnessed fall, history of dementia. EXAM: CT HEAD WITHOUT CONTRAST CT CERVICAL SPINE WITHOUT CONTRAST TECHNIQUE: Multidetector CT imaging of the head and cervical spine was performed following the standard protocol without intravenous contrast. Multiplanar CT image reconstructions of the cervical spine were also generated. COMPARISON:  07/17/2017 CT head FINDINGS: CT HEAD FINDINGS Brain: No evidence of acute infarction, hemorrhage, hydrocephalus, extra-axial collection or mass lesion/mass effect. Nonspecific foci of hypoattenuation within subcortical and periventricular white matter is compatible with moderate chronic microvascular ischemic changes and there is moderate brain parenchymal volume loss. Vascular: Calcific atherosclerosis of the carotid siphons. No hyperdense vessel. Skull: Normal. Negative for fracture or focal lesion. Sinuses/Orbits: Opacification of the sphenoid sinus with expansile soft tissue attenuation, amorphous calcifications, and erosion into the clivus. Otherwise normal aeration of paranasal sinuses. Normal aeration of mastoid air cells. Bilateral intra-ocular lens replacement. Other: None. CT CERVICAL SPINE FINDINGS Alignment: Straightening of cervical lordosis. C7-T1 grade 1 anterolisthesis. Skull base and vertebrae: No acute fracture. No primary bone lesion or focal pathologic process. Soft tissues and spinal canal: No prevertebral fluid or swelling. No visible canal hematoma.  Disc levels: Moderate cervical spondylosis with multilevel disc and facet degenerative changes. C3-C7 uncovertebral and facet hypertrophy results in bony foraminal stenosis. Multilevel mild to moderate stenosis greatest at the C5-6 level. Upper chest: Right lung apex pleuroparenchymal scarring. Other: Left lobe of thyroid nodule measuring up to 23 mm. Calcific atherosclerosis of the carotid siphons. IMPRESSION: CT head: 1. No acute intracranial abnormality or calvarial fracture identified. 2. Moderate chronic microvascular ischemic changes and parenchymal volume loss of the brain. 3. Expansile opacification of the sphenoid sinus with calcifications eroding into the clivus. Findings probably represent a large sinus polyp or mucocele, less likely neoplasm arising from clivus. Nonemergent skull base MRI with and without contrast. CT cervical spine: 1. No acute fracture or dislocation identified. 2. 23 mm nodule within left  lobe of thyroid. Nonemergent thyroid ultrasound is recommended. 3. Moderate spondylosis of the cervical spine. Electronically Signed   By: Mitzi Hansen M.D.   On: 07/21/2017 04:24   Ct Cervical Spine Wo Contrast  Result Date: 07/21/2017 CLINICAL DATA:  84 y/o  F; unwitnessed fall, history of dementia. EXAM: CT HEAD WITHOUT CONTRAST CT CERVICAL SPINE WITHOUT CONTRAST TECHNIQUE: Multidetector CT imaging of the head and cervical spine was performed following the standard protocol without intravenous contrast. Multiplanar CT image reconstructions of the cervical spine were also generated. COMPARISON:  07/17/2017 CT head FINDINGS: CT HEAD FINDINGS Brain: No evidence of acute infarction, hemorrhage, hydrocephalus, extra-axial collection or mass lesion/mass effect. Nonspecific foci of hypoattenuation within subcortical and periventricular white matter is compatible with moderate chronic microvascular ischemic changes and there is moderate brain parenchymal volume loss. Vascular: Calcific  atherosclerosis of the carotid siphons. No hyperdense vessel. Skull: Normal. Negative for fracture or focal lesion. Sinuses/Orbits: Opacification of the sphenoid sinus with expansile soft tissue attenuation, amorphous calcifications, and erosion into the clivus. Otherwise normal aeration of paranasal sinuses. Normal aeration of mastoid air cells. Bilateral intra-ocular lens replacement. Other: None. CT CERVICAL SPINE FINDINGS Alignment: Straightening of cervical lordosis. C7-T1 grade 1 anterolisthesis. Skull base and vertebrae: No acute fracture. No primary bone lesion or focal pathologic process. Soft tissues and spinal canal: No prevertebral fluid or swelling. No visible canal hematoma. Disc levels: Moderate cervical spondylosis with multilevel disc and facet degenerative changes. C3-C7 uncovertebral and facet hypertrophy results in bony foraminal stenosis. Multilevel mild to moderate stenosis greatest at the C5-6 level. Upper chest: Right lung apex pleuroparenchymal scarring. Other: Left lobe of thyroid nodule measuring up to 23 mm. Calcific atherosclerosis of the carotid siphons. IMPRESSION: CT head: 1. No acute intracranial abnormality or calvarial fracture identified. 2. Moderate chronic microvascular ischemic changes and parenchymal volume loss of the brain. 3. Expansile opacification of the sphenoid sinus with calcifications eroding into the clivus. Findings probably represent a large sinus polyp or mucocele, less likely neoplasm arising from clivus. Nonemergent skull base MRI with and without contrast. CT cervical spine: 1. No acute fracture or dislocation identified. 2. 23 mm nodule within left lobe of thyroid. Nonemergent thyroid ultrasound is recommended. 3. Moderate spondylosis of the cervical spine. Electronically Signed   By: Mitzi Hansen M.D.   On: 07/21/2017 04:24   Ct Shoulder Left Wo Contrast  Result Date: 07/21/2017 CLINICAL DATA:  Humerus fracture after fall yesterday. EXAM: CT OF  THE UPPER LEFT EXTREMITY WITHOUT CONTRAST TECHNIQUE: Multidetector CT imaging of the upper left extremity was performed according to the standard protocol. COMPARISON:  Left shoulder x-rays from same day. FINDINGS: Bones/Joint/Cartilage There is a comminuted, oblique fracture through the surgical neck of the humerus with anterior displacement of the humeral diaphysis thigh approximately 2.0 cm. The humeral head remains opposed to the glenoid, but is significantly internally rotated and slightly abducted. There is a large glenohumeral joint effusion. The bones are osteopenic. Ligaments Suboptimally assessed by CT. Muscles and Tendons The rotator cuff is grossly intact.  No significant muscle atrophy. Soft tissues Emphysematous changes in the left lung. Partially visualized enlarged left thyroid lobe containing a 2.4 cm hypodense nodule. IMPRESSION: 1. Comminuted, oblique fracture through the surgical neck of the proximal humerus with approximately 2 cm anterior displacement. 2. No dislocation.  Large glenohumeral joint effusion. Electronically Signed   By: Obie Dredge M.D.   On: 07/21/2017 11:43   Dg Shoulder Left  Result Date: 07/21/2017 CLINICAL DATA:  Left shoulder  pain after unwitnessed fall. EXAM: LEFT SHOULDER - 2+ VIEW COMPARISON:  None. FINDINGS: Significantly displaced fracture through the surgical neck of the humerus with greater than 1 shaft with anterior displacement of the humeral shaft. Glenohumeral alignment is difficult to assess, humeral head likely remains articulating with the glenoid. The acromioclavicular joint is congruent. IMPRESSION: Significantly displaced fracture through the surgical neck of the humerus. Glenohumeral alignment is difficult to assess but likely congruent. Electronically Signed   By: Rubye Oaks M.D.   On: 07/21/2017 04:05   Dg Hip Unilat With Pelvis 2-3 Views Left  Result Date: 07/21/2017 CLINICAL DATA:  84 y/o F; left hip and shoulder pain with unwitnessed  fall. EXAM: DG HIP (WITH OR WITHOUT PELVIS) 2-3V LEFT COMPARISON:  07/17/2017 pelvis and left lower extremity radiographs. FINDINGS: There is no evidence of hip fracture or dislocation. Mild osteoarthrosis of the hip joints with loss of joint space and osteophytosis. Vascular calcifications noted. Lower lumbar spondylosis partially visualized. Bones are demineralized. IMPRESSION: No acute bony or articular abnormality identified. Electronically Signed   By: Mitzi Hansen M.D.   On: 07/21/2017 04:04    EKG:   Orders placed or performed during the hospital encounter of 07/21/17  . EKG 12-Lead  . EKG 12-Lead  . ED EKG  . ED EKG      Management plans discussed with the SW , tried calling daughter n left VM   CODE STATUS:     Code Status Orders  (From admission, onward)        Start     Ordered   07/21/17 0858  Do not attempt resuscitation (DNR)  Continuous    Question Answer Comment  In the event of cardiac or respiratory ARREST Do not call a "code blue"   In the event of cardiac or respiratory ARREST Do not perform Intubation, CPR, defibrillation or ACLS   In the event of cardiac or respiratory ARREST Use medication by any route, position, wound care, and other measures to relive pain and suffering. May use oxygen, suction and manual treatment of airway obstruction as needed for comfort.      07/21/17 0857    Code Status History    This patient has a current code status but no historical code status.    Advance Directive Documentation     Most Recent Value  Type of Advance Directive  Out of facility DNR (pink MOST or yellow form)  Pre-existing out of facility DNR order (yellow form or pink MOST form)  -  "MOST" Form in Place?  -      TOTAL TIME TAKING CARE OF THIS PATIENT: 42  minutes.   Note: This dictation was prepared with Dragon dictation along with smaller phrase technology. Any transcriptional errors that result from this process are  unintentional.   @[facility] @  on 07/22/2017 at 9:31 AM  Between 7am to 6pm - Pager - [phone]  After 6pm go to [email or web address] - password [email or web address]  [facility]  Office  [phone]  CC: Primary care physician; Patient, No Pcp Per

## 2017-07-22 NOTE — [place] FL2 (Signed)
[place] MEDICAID FL2 LEVEL OF CARE SCREENING TOOL     IDENTIFICATION  Patient Name: Denise Cantu Birthdate: 1919/07/21 Sex: female Admission Date (Current Location): 07/21/2017  [place] and [place] Number:  Chiropodist and Address:  [facility], [address] [place], [place] [address]      Provider Number: [id number]  Attending Physician Name and Address:  Ramonita Lab, MD  Relative Name and Phone Number:       Current Level of Care: Hospital Recommended Level of Care: Assisted Living Facility([facility]) Prior Approval Number:    Date Approved/Denied:   PASRR Number:    Discharge Plan: Domiciliary (Rest home)([facility])    Current Diagnoses: Patient Active Problem List   Diagnosis Date Noted  . Humerus fracture 07/21/2017    Orientation RESPIRATION BLADDER Height & Weight     Self  Normal Continent Weight: 175 lb (79.4 kg) Height:  5\' 5"  (165.1 cm)  BEHAVIORAL SYMPTOMS/MOOD NEUROLOGICAL BOWEL NUTRITION STATUS      Continent Diet(Regular)  AMBULATORY STATUS COMMUNICATION OF NEEDS Skin   Extensive Assist Verbally Normal                       Personal Care Assistance Level of Assistance  Bathing, Feeding, Dressing Bathing Assistance: Limited assistance Feeding assistance: Independent Dressing Assistance: Limited assistance     Functional Limitations Info  Sight, Hearing, Speech Sight Info: Adequate Hearing Info: Adequate Speech Info: Adequate    SPECIAL CARE FACTORS FREQUENCY  PT (By licensed PT), OT (By licensed OT)     PT Frequency: (2-3 Home Health) OT Frequency: (2-3 Home Health)            Contractures      Additional Factors Info  Code Status, Allergies Code Status Info: (DNR) Allergies Info: (No Known Allergies)           Current Medications (07/22/2017):  This is the current hospital active medication list Current Facility-Administered Medications  Medication Dose Route  Frequency Provider Last Rate Last Dose  . acetaminophen (TYLENOL) tablet 650 mg  650 mg Oral Q6H PRN Shaune Pollack, MD       Or  . acetaminophen (TYLENOL) suppository 650 mg  650 mg Rectal Q6H PRN Shaune Pollack, MD      . albuterol (PROVENTIL) (2.5 MG/3ML) 0.083% nebulizer solution 2.5 mg  2.5 mg Nebulization Q2H PRN Shaune Pollack, MD      . aspirin EC tablet 81 mg  81 mg Oral Daily Shaune Pollack, MD   81 mg at 07/22/17 0944  . atorvastatin (LIPITOR) tablet 80 mg  80 mg Oral Daily Shaune Pollack, MD   80 mg at 07/22/17 0943  . bisacodyl (DULCOLAX) EC tablet 5 mg  5 mg Oral Daily PRN Shaune Pollack, MD      . calcium-vitamin D (OSCAL WITH D) 500-200 MG-UNIT per tablet 1 tablet  1 tablet Oral Q breakfast Shaune Pollack, MD   1 tablet at 07/22/17 0720  . cholecalciferol (VITAMIN D) tablet 1,000 Units  1,000 Units Oral Daily Shaune Pollack, MD   1,000 Units at 07/22/17 0944  . docusate sodium (COLACE) capsule 100 mg  100 mg Oral BID Shaune Pollack, MD   100 mg at 07/22/17 0943  . enoxaparin (LOVENOX) injection 40 mg  40 mg Subcutaneous Q24H Shaune Pollack, MD   40 mg at 07/21/17 1121  . haloperidol lactate (HALDOL) injection 0.5 mg  0.5 mg Intravenous Q6H PRN Shaune Pollack, MD  0.5 mg at 07/22/17 0242  . hydrALAZINE (APRESOLINE) injection 10 mg  10 mg Intravenous Q6H PRN Shaune Pollack, MD   10 mg at 07/21/17 0751  . HYDROcodone-acetaminophen (NORCO/VICODIN) 5-325 MG per tablet 1-2 tablet  1-2 tablet Oral Q4H PRN Shaune Pollack, MD      . isosorbide mononitrate (IMDUR) 24 hr tablet 30 mg  30 mg Oral Daily Shaune Pollack, MD   30 mg at 07/22/17 5784  . ketorolac (TORADOL) 15 MG/ML injection 15 mg  15 mg Intravenous Q6H PRN Shaune Pollack, MD      . nitroGLYCERIN (NITROSTAT) SL tablet 0.4 mg  0.4 mg Sublingual Q5 min PRN Shaune Pollack, MD      . ondansetron [facility]) tablet 4 mg  4 mg Oral Q6H PRN Shaune Pollack, MD       Or  . ondansetron [facility]) injection 4 mg  4 mg Intravenous Q6H PRN Shaune Pollack, MD      . oxyCODONE (Oxy IR/ROXICODONE) immediate release  tablet 5 mg  5 mg Oral Q4H PRN Gouru, Aruna, MD      . senna-docusate (Senokot-S) tablet 1 tablet  1 tablet Oral QHS PRN Shaune Pollack, MD      . traZODone (DESYREL) tablet 25 mg  25 mg Oral QHS Shaune Pollack, MD   25 mg at 07/21/17 2105     Discharge Medications: TAKE these medications   acetaminophen 500 MG tablet Commonly known as:  TYLENOL Take 1,000 mg by mouth 3 (three) times daily.   albuterol (2.5 MG/3ML) 0.083% nebulizer solution Commonly known as:  PROVENTIL Take 3 mLs (2.5 mg total) by nebulization every 6 (six) hours as needed for wheezing.   aspirin EC 81 MG tablet Take 81 mg by mouth daily.   atorvastatin 80 MG tablet Commonly known as:  LIPITOR Take 80 mg by mouth daily.   CALCIUM 600+D 600-400 MG-UNIT tablet Generic drug:  Calcium Carbonate-Vitamin D Take 1 tablet by mouth daily.   cholecalciferol 1000 units tablet Commonly known as:  VITAMIN D Take 1,000 Units by mouth daily.   docusate sodium 100 MG capsule Commonly known as:  COLACE Take 1 capsule (100 mg total) by mouth 2 (two) times daily.   glucosamine-chondroitin 500-400 MG tablet Take 1 tablet by mouth daily.   hydrochlorothiazide 12.5 MG capsule Commonly known as:  MICROZIDE Take 12.5 mg by mouth daily.   isosorbide mononitrate 30 MG 24 hr tablet Commonly known as:  IMDUR Take 30 mg by mouth daily.   lidocaine 5 % Commonly known as:  LIDODERM Place 1 patch onto the skin daily. Remove & Discard patch within 12 hours or as directed by MD Apply to right hip   naproxen sodium 220 MG tablet Commonly known as:  ALEVE Take 220 mg by mouth 2 (two) times daily.   nitroGLYCERIN 0.4 MG SL tablet Commonly known as:  NITROSTAT Place 0.4 mg under the tongue every 5 (five) minutes as needed for chest pain.   oxyCODONE 5 MG immediate release tablet Commonly known as:  Oxy IR/ROXICODONE Take 1 tablet (5 mg total) by mouth every 4 (four) hours as needed for severe pain or breakthrough  pain.   senna-docusate 8.6-50 MG tablet Commonly known as:  Senokot-S Take 1 tablet by mouth at bedtime as needed for mild constipation.   traZODone 50 MG tablet Commonly known as:  DESYREL Take 25 mg by mouth at bedtime.      Relevant Imaging Results:  Relevant Lab Results:   Additional Information (  SSN: [id number])  Darleene CleaverAnterhaus, Jalene Lacko R, LCSWA

## 2017-07-22 NOTE — Progress Notes (Signed)
[facility]. Assisting pt with getting dressed. Son will pick up and transport back to [facility]. IV removed.

## 2017-07-22 NOTE — Clinical Social Work Note (Addendum)
CSW faxed required clinical information to [facility].  Patient to be d/[facility] today [facility].  Patient and family agreeable to plans will transport via son's car RN to call report.  Windell MouldingEric Taaj Hurlbut, MSW, Theresia MajorsLCSWA [phone]

## 2017-07-22 NOTE — Progress Notes (Signed)
PT Cancellation Note  Patient Details Name: Denise Cantu MRN: [id number] DOB: 04-Jan-1920   Cancelled Treatment:    Reason Eval/Treat Not Completed: Fatigue/lethargy limiting ability to participate.  Order received.  Chart reviewed.  RN consulted.  Pt has been combative and is not cognitively appropriate for therapy.  Will re-attempt later if time allows.   Glenetta HewSarah Wilson Dusenbery, PT, DPT 07/22/2017, 10:22 AM

## 2017-07-22 NOTE — Progress Notes (Signed)
Patient is medically stable for [facility]. [facility] today. RN will call report and patient's won Zollie BeckersWalter will transport. Clinical Child psychotherapistocial Worker (CSW) sent D/C summary, FL2 and [facility]. Patient is aware of above. CSW contacted patient's son Zollie BeckersWalter and made him aware of above. Please reconsult if future social work needs arise. CSW signing off.   [facility] [employer]Bailey Doyl Bitting, LCSW [phone]

## 2017-07-28 ENCOUNTER — Encounter: Payer: Self-pay | Admitting: Emergency Medicine

## 2017-07-28 ENCOUNTER — Emergency Department: Payer: Medicare Other

## 2017-07-28 ENCOUNTER — Emergency Department
Admission: EM | Admit: 2017-07-28 | Discharge: 2017-07-28 | Disposition: A | Discharge status: Home / Self Care | Payer: Medicare Other | Attending: Emergency Medicine | Admitting: Emergency Medicine

## 2017-07-28 DIAGNOSIS — Z79899 Other long term (current) drug therapy: Secondary | ICD-10-CM | POA: Insufficient documentation

## 2017-07-28 DIAGNOSIS — W19XXXA Unspecified fall, initial encounter: Secondary | ICD-10-CM

## 2017-07-28 DIAGNOSIS — R296 Repeated falls: Secondary | ICD-10-CM | POA: Insufficient documentation

## 2017-07-28 DIAGNOSIS — G309 Alzheimer's disease, unspecified: Secondary | ICD-10-CM | POA: Diagnosis not present

## 2017-07-28 DIAGNOSIS — I10 Essential (primary) hypertension: Secondary | ICD-10-CM | POA: Diagnosis not present

## 2017-07-28 DIAGNOSIS — R51 Headache: Secondary | ICD-10-CM | POA: Insufficient documentation

## 2017-07-28 DIAGNOSIS — Z7982 Long term (current) use of aspirin: Secondary | ICD-10-CM | POA: Insufficient documentation

## 2017-07-28 DIAGNOSIS — M7918 Myalgia, other site: Secondary | ICD-10-CM

## 2017-07-28 NOTE — ED Provider Notes (Signed)
[facility] Emergency Department Provider Note   ____________________________________________   First MD Initiated Contact with Patient 07/28/17 [phone]     (approximate)  I have reviewed the triage vital signs and the nursing notes.   HISTORY  Chief Complaint Fall  Patient with history of dementia who is unable to participate in history.  HPI Denise Cantu is a 84 y.o. female who comes into the hospital today after a fall.  The patient is currently living in memory care.  She was seen recently after a fall which produced a left humerus fracture and some rib fractures.  Per the nursing home they last rounded on the patient's around 230.  When they went back into check on the patient she was found face down outside of the bathroom.  The patient is having some bilateral knee pain, back pain, headache.  The patient has had numerous falls in the past.   Past Medical History:  Diagnosis Date  . Alzheimer's dementia   . Carotid artery stenosis   . DDD (degenerative disc disease), cervical   . HTN (hypertension)   . Hyperlipemia   . Osteopenia   . PVD (peripheral vascular disease) (HCC)   . Vitamin D deficiency     Patient Active Problem List   Diagnosis Date Noted  . Humerus fracture 07/21/2017    Past Surgical History:  Procedure Laterality Date  . CORONARY ANGIOPLASTY      Prior to Admission medications   Medication Sig Start Date End Date Taking? Authorizing Provider  acetaminophen (TYLENOL) 500 MG tablet Take 1,000 mg by mouth 3 (three) times daily.    [provider]  albuterol (PROVENTIL) (2.5 MG/3ML) 0.083% nebulizer solution Take 3 mLs (2.5 mg total) by nebulization every 6 (six) hours as needed for wheezing. 07/22/17   Ramonita Lab, MD  aspirin EC 81 MG tablet Take 81 mg by mouth daily.    [provider]  atorvastatin (LIPITOR) 80 MG tablet Take 80 mg by mouth daily.    [provider]  Calcium Carbonate-Vitamin D  (CALCIUM 600+D) 600-400 MG-UNIT tablet Take 1 tablet by mouth daily.    [provider]  cholecalciferol (VITAMIN D) 1000 units tablet Take 1,000 Units by mouth daily.    [provider]  docusate sodium (COLACE) 100 MG capsule Take 1 capsule (100 mg total) by mouth 2 (two) times daily. 07/22/17   Ramonita Lab, MD  glucosamine-chondroitin 500-400 MG tablet Take 1 tablet by mouth daily.    [provider]  hydrochlorothiazide (MICROZIDE) 12.5 MG capsule Take 12.5 mg by mouth daily.    [provider]  isosorbide mononitrate (IMDUR) 30 MG 24 hr tablet Take 30 mg by mouth daily.    [provider]  lidocaine (LIDODERM) 5 % Place 1 patch onto the skin daily. Remove & Discard patch within 12 hours or as directed by MD Apply to right hip    [provider]  naproxen sodium (ALEVE) 220 MG tablet Take 220 mg by mouth 2 (two) times daily.    [provider]  nitroGLYCERIN (NITROSTAT) 0.4 MG SL tablet Place 0.4 mg under the tongue every 5 (five) minutes as needed for chest pain.    [provider]  oxyCODONE (OXY IR/ROXICODONE) 5 MG immediate release tablet Take 1 tablet (5 mg total) by mouth every 4 (four) hours as needed for severe pain or breakthrough pain. 07/22/17   Gouru, Deanna Artis, MD  senna-docusate (SENOKOT-S) 8.6-50 MG tablet Take 1 tablet  by mouth at bedtime as needed for mild constipation. 07/22/17   Ramonita Lab, MD  traZODone (DESYREL) 50 MG tablet Take 25 mg by mouth at bedtime.    [provider]    Allergies Patient has no known allergies.  Family History  Problem Relation Age of Onset  . CAD Mother   . CAD Sister   . CAD Brother     Social History Social History   Tobacco Use  . Smoking status: Never Smoker  . Smokeless tobacco: Never Used  Substance Use Topics  . Alcohol use: Never    Frequency: Never  . Drug use: Never    Review of Systems  Unable to assess due to patient  dementia  ____________________________________________   PHYSICAL EXAM:  VITAL SIGNS: ED Triage Vitals  Enc Vitals Group     BP 07/28/17 0422 (!) 176/82     Pulse Rate 07/28/17 0422 88     Resp 07/28/17 0422 18     Temp 07/28/17 0422 97.7 F (36.5 C)     Temp Source 07/28/17 0422 Oral     SpO2 07/28/17 0422 98 %     Weight 07/28/17 0424 175 lb (79.4 kg)     Height --      Head Circumference --      Peak Flow --      Pain Score 07/28/17 0715 Asleep     Pain Loc --      Pain Edu? --      Excl. in GC? --     Constitutional: Alert and oriented to self. Well appearing and in mild distress. Eyes: Conjunctivae are normal. PERRL. EOMI. Head: Atraumatic. Nose: No congestion/rhinnorhea. Mouth/Throat: Mucous membranes are moist.  Oropharynx non-erythematous. Neck: No cervical spine tenderness to palpation. Cardiovascular: Normal rate, regular rhythm. Grossly normal heart sounds.  Good peripheral circulation. Respiratory: Normal respiratory effort.  No retractions. Lungs CTAB. Gastrointestinal: Soft and nontender. No distention.  Musculoskeletal: Tenderness to palpation of left upper extremity and bilateral knees, tenderness to palpation of lumbar spine.. Neurologic:  Normal speech and language.  Skin:  Skin is warm, dry and intact.  Multiple bruises noted to the patient's arms and legs Psychiatric: Mood and affect are normal.   ____________________________________________   LABS (all labs ordered are listed, but only abnormal results are displayed)  Labs Reviewed - No data to display ____________________________________________  EKG  none ____________________________________________  RADIOLOGY  ED MD interpretation:    CT head and cervical spine: No evidence of traumatic intracranial injury or fracture, no evidence of fracture or subluxation along the cervical spine.  Left knee x-ray: No acute fracture, deformity or dislocation, mild left and moderate right  osteoarthrosis  Right knee x-ray: No acute fracture, deformity or dislocation, mild left and moderate right osteoarthrosis  DG lumbar spine: No fracture deformity or malalignment.  Official radiology report(s): Dg Lumbar Spine Complete  Result Date: 07/28/2017 CLINICAL DATA:  Found down at care facility. EXAM: LUMBAR SPINE - COMPLETE 4+ VIEW COMPARISON:  None. FINDINGS: There is no evidence of lumbar spine fracture. Alignment is normal. Severe L4-5 and L5-S1 disc height loss with endplate sclerosis and marginal spurring consistent with degenerative discs. Moderate L2-3 and L3-4 degenerative discs. Severe lower lumbar facet arthropathy. Osteopenia without destructive bony lesions. Aortoiliac calcifications. IMPRESSION: 1. No fracture deformity or malalignment. 2.  Aortic Atherosclerosis (ICD10-I70.0). Electronically Signed   By: Awilda Metro M.D.   On: 07/28/2017 05:09   Ct Head Wo Contrast  Result Date: 07/28/2017 CLINICAL  DATA:  Status post unwitnessed fall. Headache and back pain. Concern for cervical spine injury. EXAM: CT HEAD WITHOUT CONTRAST CT CERVICAL SPINE WITHOUT CONTRAST TECHNIQUE: Multidetector CT imaging of the head and cervical spine was performed following the standard protocol without intravenous contrast. Multiplanar CT image reconstructions of the cervical spine were also generated. COMPARISON:  CT of the head and cervical spine performed 07/21/2017 FINDINGS: CT HEAD FINDINGS Brain: No evidence of acute infarction, hemorrhage, hydrocephalus, extra-axial collection or mass lesion / mass effect. Prominence of the ventricles and sulci reflects mild to moderate cortical volume loss. Mild cerebellar atrophy is noted. Scattered periventricular and subcortical white matter change likely reflects small vessel ischemic microangiopathy. The brainstem and fourth ventricle are within normal limits. The basal ganglia are unremarkable in appearance. The cerebral hemispheres demonstrate grossly  normal gray-white differentiation. No mass effect or midline shift is seen. Vascular: No hyperdense vessel or unexpected calcification. Skull: There is no evidence of fracture; visualized osseous structures are unremarkable in appearance. Sinuses/Orbits: The orbits are within normal limits. As before, there is opacification of the sphenoid sinus with soft tissue attenuation, amorphous calcification and erosion into the clivus. The remaining paranasal sinuses and mastoid air cells are well-aerated. Other: No significant soft tissue abnormalities are seen. CT CERVICAL SPINE FINDINGS Alignment: Normal. Skull base and vertebrae: No acute fracture. No primary bone lesion or focal pathologic process. Soft tissues and spinal canal: No prevertebral fluid or swelling. No visible canal hematoma. Disc levels: Mild intervertebral disc space narrowing is noted along the cervical spine, with scattered anterior and posterior disc osteophyte complexes. Mild underlying facet disease is noted. Upper chest: Calcification is noted at the carotid bifurcations bilaterally. A 2.4 cm left thyroid nodule is noted. Blebs are noted at the lung apices. There is mild interstitial prominence. Other: No additional soft tissue abnormalities are seen. IMPRESSION: 1. No evidence of traumatic intracranial injury or fracture. 2. No evidence of fracture or subluxation along the cervical spine. 3. Mild to moderate cortical volume loss and scattered small vessel ischemic microangiopathy. 4. Expansile opacification of the sphenoid sinus again noted, with calcifications, eroding into the clivus. As before, this likely reflects a large sinus polyp or mucocele. Neoplasm is considered less likely, but cannot be excluded. MRI of the skull base with and without contrast would be helpful for further evaluation, if deemed clinically appropriate. 5. Mild degenerative change along the cervical spine. 6. Calcification at the carotid bifurcations bilaterally. 7. **An  incidental finding of potential clinical significance has been found. 2.4 cm left thyroid nodule. Consider further evaluation with thyroid ultrasound. If patient is clinically hyperthyroid, consider nuclear medicine thyroid uptake and scan.** 8. Blebs at the lung apices.  Mild interstitial prominence noted. Electronically Signed   By: Roanna RaiderJeffery  Chang M.D.   On: 07/28/2017 05:21   Ct Cervical Spine Wo Contrast  Result Date: 07/28/2017 CLINICAL DATA:  Status post unwitnessed fall. Headache and back pain. Concern for cervical spine injury. EXAM: CT HEAD WITHOUT CONTRAST CT CERVICAL SPINE WITHOUT CONTRAST TECHNIQUE: Multidetector CT imaging of the head and cervical spine was performed following the standard protocol without intravenous contrast. Multiplanar CT image reconstructions of the cervical spine were also generated. COMPARISON:  CT of the head and cervical spine performed 07/21/2017 FINDINGS: CT HEAD FINDINGS Brain: No evidence of acute infarction, hemorrhage, hydrocephalus, extra-axial collection or mass lesion / mass effect. Prominence of the ventricles and sulci reflects mild to moderate cortical volume loss. Mild cerebellar atrophy is noted. Scattered periventricular and subcortical white  matter change likely reflects small vessel ischemic microangiopathy. The brainstem and fourth ventricle are within normal limits. The basal ganglia are unremarkable in appearance. The cerebral hemispheres demonstrate grossly normal gray-white differentiation. No mass effect or midline shift is seen. Vascular: No hyperdense vessel or unexpected calcification. Skull: There is no evidence of fracture; visualized osseous structures are unremarkable in appearance. Sinuses/Orbits: The orbits are within normal limits. As before, there is opacification of the sphenoid sinus with soft tissue attenuation, amorphous calcification and erosion into the clivus. The remaining paranasal sinuses and mastoid air cells are well-aerated.  Other: No significant soft tissue abnormalities are seen. CT CERVICAL SPINE FINDINGS Alignment: Normal. Skull base and vertebrae: No acute fracture. No primary bone lesion or focal pathologic process. Soft tissues and spinal canal: No prevertebral fluid or swelling. No visible canal hematoma. Disc levels: Mild intervertebral disc space narrowing is noted along the cervical spine, with scattered anterior and posterior disc osteophyte complexes. Mild underlying facet disease is noted. Upper chest: Calcification is noted at the carotid bifurcations bilaterally. A 2.4 cm left thyroid nodule is noted. Blebs are noted at the lung apices. There is mild interstitial prominence. Other: No additional soft tissue abnormalities are seen. IMPRESSION: 1. No evidence of traumatic intracranial injury or fracture. 2. No evidence of fracture or subluxation along the cervical spine. 3. Mild to moderate cortical volume loss and scattered small vessel ischemic microangiopathy. 4. Expansile opacification of the sphenoid sinus again noted, with calcifications, eroding into the clivus. As before, this likely reflects a large sinus polyp or mucocele. Neoplasm is considered less likely, but cannot be excluded. MRI of the skull base with and without contrast would be helpful for further evaluation, if deemed clinically appropriate. 5. Mild degenerative change along the cervical spine. 6. Calcification at the carotid bifurcations bilaterally. 7. **An incidental finding of potential clinical significance has been found. 2.4 cm left thyroid nodule. Consider further evaluation with thyroid ultrasound. If patient is clinically hyperthyroid, consider nuclear medicine thyroid uptake and scan.** 8. Blebs at the lung apices.  Mild interstitial prominence noted. Electronically Signed   By: Roanna Raider M.D.   On: 07/28/2017 05:21   Dg Knee Complete 4 Views Left  Result Date: 07/28/2017 CLINICAL DATA:  Unwitnessed fall at care facility. EXAM: LEFT  KNEE - COMPLETE 4+ VIEW; RIGHT KNEE - COMPLETE 4+ VIEW COMPARISON:  None. FINDINGS: LEFT knee: No fracture deformity or dislocation. Mild medial and patellofemoral compartment narrowing. No destructive bony lesions. Osteopenia. Mild vascular calcifications. RIGHT: No acute fracture deformity or dislocation. Moderate tricompartmental joint space narrowing with periarticular sclerosis and mild marginal spurring. Osteopenia without destructive bony lesions. Mild vascular calcifications. IMPRESSION: No acute fracture deformity or dislocation. Mild LEFT and moderate RIGHT osteoarthrosis. Electronically Signed   By: Awilda Metro M.D.   On: 07/28/2017 05:07   Dg Knee Complete 4 Views Right  Result Date: 07/28/2017 CLINICAL DATA:  Unwitnessed fall at care facility. EXAM: LEFT KNEE - COMPLETE 4+ VIEW; RIGHT KNEE - COMPLETE 4+ VIEW COMPARISON:  None. FINDINGS: LEFT knee: No fracture deformity or dislocation. Mild medial and patellofemoral compartment narrowing. No destructive bony lesions. Osteopenia. Mild vascular calcifications. RIGHT: No acute fracture deformity or dislocation. Moderate tricompartmental joint space narrowing with periarticular sclerosis and mild marginal spurring. Osteopenia without destructive bony lesions. Mild vascular calcifications. IMPRESSION: No acute fracture deformity or dislocation. Mild LEFT and moderate RIGHT osteoarthrosis. Electronically Signed   By: Awilda Metro M.D.   On: 07/28/2017 05:07    ____________________________________________  PROCEDURES  Procedure(s) performed: None  Procedures  Critical Care performed: No  ____________________________________________   INITIAL IMPRESSION / ASSESSMENT AND PLAN / ED COURSE  As part of my medical decision making, I reviewed the following data within the electronic MEDICAL RECORD NUMBER Notes from prior ED visits and [place] Controlled Substance Database   This is a 84 year old female who comes into the hospital today  after an unwitnessed fall at her nursing home.  The patient has a history of frequent falls and was seen on April 9 after a fall.  The patient's care was increased but she is still falling.  We did not check any blood work as the patient does not have any altered mental status.  The patient's imaging studies are unremarkable.  The patient's blood work on the ninth and 10 February were unremarkable.  She will be discharged back to her nursing home.      ____________________________________________   FINAL CLINICAL IMPRESSION(S) / ED DIAGNOSES  Final diagnoses:  Fall, initial encounter  Musculoskeletal pain     ED Discharge Orders    None       Note:  This document was prepared using Dragon voice recognition software and may include unintentional dictation errors.    Rebecka Apley, MD 07/28/17 [phone]

## 2017-07-28 NOTE — Discharge Instructions (Addendum)
Please follow up with your primary care physician.

## 2017-07-28 NOTE — ED Triage Notes (Signed)
[facility] post unwitnessed fall onto floor. Facility reported pt was found face down outside of bathroom. Pt c/o bilateral knee/leg pain as well as HA and back pain. Pt is alert to self only at baseline.

## 2017-08-03 ENCOUNTER — Emergency Department: Payer: Medicare Other

## 2017-08-03 ENCOUNTER — Other Ambulatory Visit: Payer: Self-pay

## 2017-08-03 ENCOUNTER — Encounter: Payer: Self-pay | Admitting: Emergency Medicine

## 2017-08-03 ENCOUNTER — Emergency Department
Admission: EM | Admit: 2017-08-03 | Discharge: 2017-08-03 | Disposition: A | Discharge status: Home / Self Care | Payer: Medicare Other | Attending: Emergency Medicine | Admitting: Emergency Medicine

## 2017-08-03 DIAGNOSIS — I10 Essential (primary) hypertension: Secondary | ICD-10-CM | POA: Diagnosis not present

## 2017-08-03 DIAGNOSIS — Z79899 Other long term (current) drug therapy: Secondary | ICD-10-CM | POA: Insufficient documentation

## 2017-08-03 DIAGNOSIS — M25512 Pain in left shoulder: Secondary | ICD-10-CM | POA: Diagnosis present

## 2017-08-03 DIAGNOSIS — G309 Alzheimer's disease, unspecified: Secondary | ICD-10-CM | POA: Diagnosis not present

## 2017-08-03 DIAGNOSIS — N3000 Acute cystitis without hematuria: Secondary | ICD-10-CM | POA: Diagnosis not present

## 2017-08-03 DIAGNOSIS — W19XXXA Unspecified fall, initial encounter: Secondary | ICD-10-CM | POA: Insufficient documentation

## 2017-08-03 DIAGNOSIS — F0280 Dementia in other diseases classified elsewhere without behavioral disturbance: Secondary | ICD-10-CM | POA: Diagnosis not present

## 2017-08-03 DIAGNOSIS — Z7982 Long term (current) use of aspirin: Secondary | ICD-10-CM | POA: Insufficient documentation

## 2017-08-03 LAB — COMPREHENSIVE METABOLIC PANEL
ALBUMIN: 4 g/dL (ref 3.5–5.0)
ALK PHOS: 93 U/L (ref 38–126)
ALT: 16 U/L (ref 14–54)
AST: 19 U/L (ref 15–41)
Anion gap: 8 (ref 5–15)
BUN: 24 mg/dL — ABNORMAL HIGH (ref 6–20)
CO2: 30 mmol/L (ref 22–32)
CREATININE: 0.79 mg/dL (ref 0.44–1.00)
Calcium: 9.2 mg/dL (ref 8.9–10.3)
Chloride: 102 mmol/L (ref 101–111)
GFR calc Af Amer: >60 mL/min (ref >60)
GFR calc non Af Amer: >60 mL/min (ref >60)
GLUCOSE: 105 mg/dL — AB (ref 65–99)
Potassium: 3.6 mmol/L (ref 3.5–5.1)
SODIUM: 140 mmol/L (ref 135–145)
Total Bilirubin: 0.5 mg/dL (ref 0.3–1.2)
Total Protein: 6.8 g/dL (ref 6.5–8.1)

## 2017-08-03 LAB — URINALYSIS, COMPLETE (UACMP) WITH MICROSCOPIC
Bilirubin Urine: NEGATIVE
GLUCOSE, UA: NEGATIVE mg/dL
Hgb urine dipstick: NEGATIVE
Ketones, ur: NEGATIVE mg/dL
Nitrite: NEGATIVE
Protein, ur: NEGATIVE mg/dL
Specific Gravity, Urine: 1.01 (ref 1.005–1.030)
pH: 6 (ref 5.0–8.0)

## 2017-08-03 LAB — CBC WITH DIFFERENTIAL/PLATELET
BASOS PCT: 1 %
Basophils Absolute: 0.1 10*3/uL (ref 0–0.1)
EOS ABS: 0.2 10*3/uL (ref 0–0.7)
Eosinophils Relative: 2 %
HEMATOCRIT: 40 % (ref 35.0–47.0)
Hemoglobin: 13.4 g/dL (ref 12.0–16.0)
Lymphocytes Relative: 20 %
Lymphs Abs: 2 10*3/uL (ref 1.0–3.6)
MCH: 31.2 pg (ref 26.0–34.0)
MCHC: 33.6 g/dL (ref 32.0–36.0)
MCV: 93 fL (ref 80.0–100.0)
MONO ABS: 0.7 10*3/uL (ref 0.2–0.9)
MONOS PCT: 7 %
NEUTROS ABS: 7 10*3/uL — AB (ref 1.4–6.5)
Neutrophils Relative %: 70 %
Platelets: 297 10*3/uL (ref 150–440)
RBC: 4.3 MIL/uL (ref 3.80–5.20)
RDW: 13.5 % (ref 11.5–14.5)
WBC: 9.9 10*3/uL (ref 3.6–11.0)

## 2017-08-03 LAB — TROPONIN I: Troponin I: <0.03 ng/mL (ref <0.03)

## 2017-08-03 MED ORDER — CEPHALEXIN 250 MG PO CAPS: 250.0000 mg | ORAL_CAPSULE | Freq: Three times a day (TID) | ORAL | 0 refills | Status: AC

## 2017-08-03 MED ORDER — FENTANYL CITRATE (PF) 100 MCG/2ML IJ SOLN
25.0000 ug | Freq: Once | INTRAMUSCULAR | Status: AC
  Administered 2017-08-03: 25 ug via INTRAVENOUS
  Filled 2017-08-03: qty 2

## 2017-08-03 MED ORDER — SODIUM CHLORIDE 0.9 % IV SOLN
1.0000 g | Freq: Once | INTRAVENOUS | Status: AC
  Administered 2017-08-03: 1 g via INTRAVENOUS
  Filled 2017-08-03: qty 10

## 2017-08-03 NOTE — ED Notes (Signed)
Pt requested bedpan; states she cannot go, asked to leave bedpan in place.

## 2017-08-03 NOTE — ED Notes (Signed)
Patient transported to CT via stretcher with Karen 

## 2017-08-03 NOTE — ED Provider Notes (Signed)
[facility] Emergency Department Provider Note  ____________________________________________   First MD Initiated Contact with Patient 08/03/17 [phone]     (approximate)  I have reviewed the triage vital signs and the nursing notes.   HISTORY  Chief Complaint Fall  Level 5 exemption history limited by the patient's dementia  HPI Denise Cantu is a 84 y.o. female who comes to the emergency department via EMS after an unwitnessed fall in her nursing home.  She has a past medical history of severe dementia and recently was hospitalized for an unwitnessed fall sustaining a severe proximal left humerus fracture.  The patient herself reports moderate severity throbbing aching in her left shoulder but otherwise no complaints.  She is not sure if she passed out.  Apparently she was able to ambulate prior to EMS arrival.  Past Medical History:  Diagnosis Date  . Alzheimer's dementia   . Carotid artery stenosis   . DDD (degenerative disc disease), cervical   . HTN (hypertension)   . Hyperlipemia   . Osteopenia   . PVD (peripheral vascular disease) (HCC)   . Vitamin D deficiency     Patient Active Problem List   Diagnosis Date Noted  . Humerus fracture 07/21/2017    Past Surgical History:  Procedure Laterality Date  . CORONARY ANGIOPLASTY      Prior to Admission medications   Medication Sig Start Date End Date Taking? Authorizing Provider  acetaminophen (TYLENOL) 500 MG tablet Take 1,000 mg by mouth 3 (three) times daily.   Yes [provider]  albuterol (PROVENTIL) (2.5 MG/3ML) 0.083% nebulizer solution Take 3 mLs (2.5 mg total) by nebulization every 6 (six) hours as needed for wheezing. 07/22/17  Yes Gouru, Deanna ArtisAruna, MD  aspirin EC 81 MG tablet Take 81 mg by mouth daily.   Yes [provider]  atorvastatin (LIPITOR) 80 MG tablet Take 80 mg by mouth daily.   Yes [provider]  Calcium Carbonate-Vitamin D (CALCIUM 600+D) 600-400  MG-UNIT tablet Take 1 tablet by mouth daily.   Yes [provider]  cholecalciferol (VITAMIN D) 1000 units tablet Take 1,000 Units by mouth daily.   Yes [provider]  docusate sodium (COLACE) 100 MG capsule Take 1 capsule (100 mg total) by mouth 2 (two) times daily. 07/22/17  Yes Gouru, Deanna ArtisAruna, MD  glucosamine-chondroitin 500-400 MG tablet Take 1 tablet by mouth daily.   Yes [provider]  hydrochlorothiazide (MICROZIDE) 12.5 MG capsule Take 12.5 mg by mouth daily.   Yes [provider]  isosorbide mononitrate (IMDUR) 30 MG 24 hr tablet Take 30 mg by mouth daily.   Yes [provider]  nitroGLYCERIN (NITROSTAT) 0.4 MG SL tablet Place 0.4 mg under the tongue every 5 (five) minutes as needed for chest pain.   Yes [provider]  oxyCODONE (OXY IR/ROXICODONE) 5 MG immediate release tablet Take 1 tablet (5 mg total) by mouth every 4 (four) hours as needed for severe pain or breakthrough pain. 07/22/17  Yes Gouru, Aruna, MD  senna-docusate (SENOKOT-S) 8.6-50 MG tablet Take 1 tablet by mouth at bedtime as needed for mild constipation. 07/22/17  Yes Gouru, Deanna ArtisAruna, MD  traZODone (DESYREL) 50 MG tablet Take 25 mg by mouth at bedtime.   Yes [provider]  cephALEXin (KEFLEX) 250 MG capsule Take 1 capsule (250 mg total) by mouth 3 (three) times daily for 5 days. 08/03/17 08/08/17  Merrily Brittleifenbark, Hodges Treiber, MD    Allergies Patient has no known allergies.  Family History  Problem Relation Age of Onset  . CAD Mother   . CAD Sister   . CAD Brother     Social History Social History   Tobacco Use  . Smoking status: Never Smoker  . Smokeless tobacco: Never Used  Substance Use Topics  . Alcohol use: Never    Frequency: Never  . Drug use: Never    Review of Systems Level 5 exemption history limited by the patient's dementia  ____________________________________________   PHYSICAL EXAM:  VITAL SIGNS: ED Triage Vitals [08/03/17 0625]  Enc  Vitals Group     BP (!) 176/84     Pulse Rate 76     Resp 18     Temp 98.3 F (36.8 C)     Temp Source Oral     SpO2 95 %     Weight      Height      Head Circumference      Peak Flow      Pain Score      Pain Loc      Pain Edu?      Excl. in GC?     Constitutional: In no acute distress.  Severe dementia Eyes: PERRL EOMI. midrange and brisk Head: Atraumatic. Nose: No congestion/rhinnorhea. Mouth/Throat: No trismus Neck: No stridor.   Cardiovascular: Normal rate, regular rhythm. Grossly normal heart sounds.  Good peripheral circulation. Respiratory: Normal respiratory effort.  No retractions. Lungs CTAB and moving good air Gastrointestinal: Soft nontender.  Diaper dependent Musculoskeletal: Legs are equal in length no rotation.  No discomfort with logroll and full range of motion bilateral hips.  Her left shoulder has a severe deformity Neurologic: Moves all 4 Skin:  Skin is warm, dry and intact. No rash noted. Psychiatric: Severe dementia   ____________________________________________   DIFFERENTIAL includes but not limited to  Mechanical fall, syncope, cardiogenic syncope, urinary tract infection, intracerebral hemorrhage, hip fracture ____________________________________________   LABS (all labs ordered are listed, but only abnormal results are displayed)  Labs Reviewed  COMPREHENSIVE METABOLIC PANEL - Abnormal; Notable for the following components:      Result Value   Glucose, Bld 105 (*)    BUN 24 (*)    All other components within normal limits  CBC WITH DIFFERENTIAL/PLATELET - Abnormal; Notable for the following components:   Neutro Abs 7.0 (*)    All other components within normal limits  URINALYSIS, COMPLETE (UACMP) WITH MICROSCOPIC - Abnormal; Notable for the following components:   Color, Urine STRAW (*)    APPearance HAZY (*)    Leukocytes, UA MODERATE (*)    Squamous Epithelial / LPF 0-5 (*)    Non Squamous Epithelial 0-5 (*)    All other  components within normal limits  URINE CULTURE  TROPONIN I    Lab work reviewed by me consistent with urinary tract infection __________________________________________  EKG  ED ECG REPORT I, Merrily Brittle, the attending physician, personally viewed and interpreted this ECG.  Date: 08/03/2017 EKG Time:  Rate: 79 Rhythm: normal sinus rhythm QRS Axis: Rightward axis Intervals: normal ST/T Wave abnormalities: normal Narrative Interpretation: no evidence of acute ischemia.  EKG consistent with previous  ____________________________________________  RADIOLOGY  Head CT reviewed by me with chronic changes no acute disease Chest x-ray reviewed by me with possible T4 compression fracture otherwise unremarkable Left shoulder x-ray reviewed by me with chronic changes but no acute disease Left hip x-ray reviewed by me with no acute disease  ____________________________________________   PROCEDURES  Procedure(s) performed: no  Procedures  Critical Care performed: no  Observation: no ____________________________________________   INITIAL IMPRESSION / ASSESSMENT AND PLAN / ED COURSE  Pertinent labs & imaging results that were available during my care of the patient were reviewed by me and considered in my medical decision making (see chart for details).  The patient arrives to the emergency department after an unwitnessed fall in her nursing home.  The patient has severe dementia and does not recall what happened she only knows that her left shoulder hurts.  Lab work is reassuring and her EKG is nonischemic and consistent with previous.  Head CT with no acute disease and while her left shoulder x-ray does show a significant deformity is unchanged from previous.  She does have what appears to be urinary tract infection so I will give her a gram of ceftriaxone now and send a urine culture.  At this point there is no indication for acute inpatient admission as the patient resides in  a nursing home.  I will cover her with Keflex as an outpatient.  She is discharged home in improved condition.  Strict return precautions have been given.      ____________________________________________   FINAL CLINICAL IMPRESSION(S) / ED DIAGNOSES  Final diagnoses:  Fall, initial encounter  Acute cystitis without hematuria      NEW MEDICATIONS STARTED DURING THIS VISIT:  New Prescriptions   CEPHALEXIN (KEFLEX) 250 MG CAPSULE    Take 1 capsule (250 mg total) by mouth 3 (three) times daily for 5 days.     Note:  This document was prepared using Dragon voice recognition software and may include unintentional dictation errors.     Merrily Brittle, MD 08/03/17 [phone]

## 2017-08-03 NOTE — ED Notes (Signed)
Today is pt's 4th visit since April 5th for fall; pt arrives today with left arm immobilized due to humerus fracture from fall on 07/21/17;

## 2017-08-03 NOTE — ED Notes (Signed)
[place], awakened with arrival. Pt asked for bedpan and was able to lift her hips to assist with placement and removal. NAD noted.

## 2017-08-03 NOTE — ED Triage Notes (Signed)
[facility]; pt c/o pain to left leg and left arm; pt awake and alert, oriented to person only

## 2017-08-03 NOTE — Discharge Instructions (Signed)
Please take all of your antibiotics as prescribed and follow-up with your primary care physician in 2 days for reexamination.  Return to the emergency department sooner for any concerns.  It was a pleasure to take care of you today, and thank you for coming to our emergency department.  If you have any questions or concerns before leaving please ask the nurse to grab me and I'm more than happy to go through your aftercare instructions again.  If you were prescribed any opioid pain medication today such as Norco, Vicodin, Percocet, morphine, hydrocodone, or oxycodone please make sure you do not drive when you are taking this medication as it can alter your ability to drive safely.  If you have any concerns once you are home that you are not improving or are in fact getting worse before you can make it to your follow-up appointment, please do not hesitate to call 911 and come back for further evaluation.  Merrily Brittle, MD  Results for orders placed or performed during the hospital encounter of 08/03/17  Comprehensive metabolic panel  Result Value Ref Range   Sodium 140 135 - 145 mmol/L   Potassium 3.6 3.5 - 5.1 mmol/L   Chloride 102 101 - 111 mmol/L   CO2 30 22 - 32 mmol/L   Glucose, Bld 105 (H) 65 - 99 mg/dL   BUN 24 (H) 6 - 20 mg/dL   Creatinine, Ser 1.61 0.44 - 1.00 mg/dL   Calcium 9.2 8.9 - 09.6 mg/dL   Total Protein 6.8 6.5 - 8.1 g/dL   Albumin 4.0 3.5 - 5.0 g/dL   AST 19 15 - 41 U/L   ALT 16 14 - 54 U/L   Alkaline Phosphatase 93 38 - 126 U/L   Total Bilirubin 0.5 0.3 - 1.2 mg/dL   GFR calc non Af Amer >60 >60 mL/min   GFR calc Af Amer >60 >60 mL/min   Anion gap 8 5 - 15  CBC with Differential  Result Value Ref Range   WBC 9.9 3.6 - 11.0 K/uL   RBC 4.30 3.80 - 5.20 MIL/uL   Hemoglobin 13.4 12.0 - 16.0 g/dL   HCT 04.5 40.9 - 81.1 %   MCV 93.0 80.0 - 100.0 fL   MCH 31.2 26.0 - 34.0 pg   MCHC 33.6 32.0 - 36.0 g/dL   RDW 91.4 78.2 - 95.6 %   Platelets 297 150 - 440 K/uL   Neutrophils Relative % 70 %   Neutro Abs 7.0 (H) 1.4 - 6.5 K/uL   Lymphocytes Relative 20 %   Lymphs Abs 2.0 1.0 - 3.6 K/uL   Monocytes Relative 7 %   Monocytes Absolute 0.7 0.2 - 0.9 K/uL   Eosinophils Relative 2 %   Eosinophils Absolute 0.2 0 - 0.7 K/uL   Basophils Relative 1 %   Basophils Absolute 0.1 0 - 0.1 K/uL  Troponin I  Result Value Ref Range   Troponin I <0.03 <0.03 ng/mL  Urinalysis, Complete w Microscopic  Result Value Ref Range   Color, Urine STRAW (A) YELLOW   APPearance HAZY (A) CLEAR   Specific Gravity, Urine 1.010 1.005 - 1.030   pH 6.0 5.0 - 8.0   Glucose, UA NEGATIVE NEGATIVE mg/dL   Hgb urine dipstick NEGATIVE NEGATIVE   Bilirubin Urine NEGATIVE NEGATIVE   Ketones, ur NEGATIVE NEGATIVE mg/dL   Protein, ur NEGATIVE NEGATIVE mg/dL   Nitrite NEGATIVE NEGATIVE   Leukocytes, UA MODERATE (A) NEGATIVE   RBC / HPF 0-5 0 -  5 RBC/hpf   WBC, UA TOO NUMEROUS TO COUNT 0 - 5 WBC/hpf   Squamous Epithelial / LPF 0-5 (A) NONE SEEN   WBC Clumps PRESENT    Non Squamous Epithelial 0-5 (A) NONE SEEN   Dg Lumbar Spine Complete  Result Date: 07/28/2017 CLINICAL DATA:  Found down at care facility. EXAM: LUMBAR SPINE - COMPLETE 4+ VIEW COMPARISON:  None. FINDINGS: There is no evidence of lumbar spine fracture. Alignment is normal. Severe L4-5 and L5-S1 disc height loss with endplate sclerosis and marginal spurring consistent with degenerative discs. Moderate L2-3 and L3-4 degenerative discs. Severe lower lumbar facet arthropathy. Osteopenia without destructive bony lesions. Aortoiliac calcifications. IMPRESSION: 1. No fracture deformity or malalignment. 2.  Aortic Atherosclerosis (ICD10-I70.0). Electronically Signed   By: Awilda Metro M.D.   On: 07/28/2017 05:09   Ct Head Wo Contrast  Result Date: 08/03/2017 CLINICAL DATA:  Unwitnessed fall EXAM: CT HEAD WITHOUT CONTRAST TECHNIQUE: Contiguous axial images were obtained from the base of the skull through the vertex without  intravenous contrast. COMPARISON:  Head CT 07/28/2017 FINDINGS: Brain: No mass lesion, intraparenchymal hemorrhage or extra-axial collection. No evidence of acute cortical infarct. Normal appearance of the brain parenchyma and extra axial spaces for age. Vascular: No hyperdense vessel or unexpected vascular calcification. Skull: Normal visualized skull base, calvarium and extracranial soft tissues. Sinuses/Orbits: Unchanged appearance opacified sphenoid sinus with erosion of the posterior margin of the clivus and inferior posterior sella. Normal orbits. IMPRESSION: 1. Normal aging brain. 2. Unchanged appearance of completely opacified sphenoid sinus with erosion of the posterior clival margin. Electronically Signed   By: Deatra Robinson M.D.   On: 08/03/2017 06:59   Ct Head Wo Contrast  Result Date: 07/28/2017 CLINICAL DATA:  Status post unwitnessed fall. Headache and back pain. Concern for cervical spine injury. EXAM: CT HEAD WITHOUT CONTRAST CT CERVICAL SPINE WITHOUT CONTRAST TECHNIQUE: Multidetector CT imaging of the head and cervical spine was performed following the standard protocol without intravenous contrast. Multiplanar CT image reconstructions of the cervical spine were also generated. COMPARISON:  CT of the head and cervical spine performed 07/21/2017 FINDINGS: CT HEAD FINDINGS Brain: No evidence of acute infarction, hemorrhage, hydrocephalus, extra-axial collection or mass lesion / mass effect. Prominence of the ventricles and sulci reflects mild to moderate cortical volume loss. Mild cerebellar atrophy is noted. Scattered periventricular and subcortical white matter change likely reflects small vessel ischemic microangiopathy. The brainstem and fourth ventricle are within normal limits. The basal ganglia are unremarkable in appearance. The cerebral hemispheres demonstrate grossly normal gray-white differentiation. No mass effect or midline shift is seen. Vascular: No hyperdense vessel or unexpected  calcification. Skull: There is no evidence of fracture; visualized osseous structures are unremarkable in appearance. Sinuses/Orbits: The orbits are within normal limits. As before, there is opacification of the sphenoid sinus with soft tissue attenuation, amorphous calcification and erosion into the clivus. The remaining paranasal sinuses and mastoid air cells are well-aerated. Other: No significant soft tissue abnormalities are seen. CT CERVICAL SPINE FINDINGS Alignment: Normal. Skull base and vertebrae: No acute fracture. No primary bone lesion or focal pathologic process. Soft tissues and spinal canal: No prevertebral fluid or swelling. No visible canal hematoma. Disc levels: Mild intervertebral disc space narrowing is noted along the cervical spine, with scattered anterior and posterior disc osteophyte complexes. Mild underlying facet disease is noted. Upper chest: Calcification is noted at the carotid bifurcations bilaterally. A 2.4 cm left thyroid nodule is noted. Blebs are noted at the lung apices. There  is mild interstitial prominence. Other: No additional soft tissue abnormalities are seen. IMPRESSION: 1. No evidence of traumatic intracranial injury or fracture. 2. No evidence of fracture or subluxation along the cervical spine. 3. Mild to moderate cortical volume loss and scattered small vessel ischemic microangiopathy. 4. Expansile opacification of the sphenoid sinus again noted, with calcifications, eroding into the clivus. As before, this likely reflects a large sinus polyp or mucocele. Neoplasm is considered less likely, but cannot be excluded. MRI of the skull base with and without contrast would be helpful for further evaluation, if deemed clinically appropriate. 5. Mild degenerative change along the cervical spine. 6. Calcification at the carotid bifurcations bilaterally. 7. **An incidental finding of potential clinical significance has been found. 2.4 cm left thyroid nodule. Consider further  evaluation with thyroid ultrasound. If patient is clinically hyperthyroid, consider nuclear medicine thyroid uptake and scan.** 8. Blebs at the lung apices.  Mild interstitial prominence noted. Electronically Signed   By: Roanna RaiderJeffery  Chang M.D.   On: 07/28/2017 05:21   Ct Head Wo Contrast  Result Date: 07/21/2017 CLINICAL DATA:  84 y/o  F; unwitnessed fall, history of dementia. EXAM: CT HEAD WITHOUT CONTRAST CT CERVICAL SPINE WITHOUT CONTRAST TECHNIQUE: Multidetector CT imaging of the head and cervical spine was performed following the standard protocol without intravenous contrast. Multiplanar CT image reconstructions of the cervical spine were also generated. COMPARISON:  07/17/2017 CT head FINDINGS: CT HEAD FINDINGS Brain: No evidence of acute infarction, hemorrhage, hydrocephalus, extra-axial collection or mass lesion/mass effect. Nonspecific foci of hypoattenuation within subcortical and periventricular white matter is compatible with moderate chronic microvascular ischemic changes and there is moderate brain parenchymal volume loss. Vascular: Calcific atherosclerosis of the carotid siphons. No hyperdense vessel. Skull: Normal. Negative for fracture or focal lesion. Sinuses/Orbits: Opacification of the sphenoid sinus with expansile soft tissue attenuation, amorphous calcifications, and erosion into the clivus. Otherwise normal aeration of paranasal sinuses. Normal aeration of mastoid air cells. Bilateral intra-ocular lens replacement. Other: None. CT CERVICAL SPINE FINDINGS Alignment: Straightening of cervical lordosis. C7-T1 grade 1 anterolisthesis. Skull base and vertebrae: No acute fracture. No primary bone lesion or focal pathologic process. Soft tissues and spinal canal: No prevertebral fluid or swelling. No visible canal hematoma. Disc levels: Moderate cervical spondylosis with multilevel disc and facet degenerative changes. C3-C7 uncovertebral and facet hypertrophy results in bony foraminal stenosis.  Multilevel mild to moderate stenosis greatest at the C5-6 level. Upper chest: Right lung apex pleuroparenchymal scarring. Other: Left lobe of thyroid nodule measuring up to 23 mm. Calcific atherosclerosis of the carotid siphons. IMPRESSION: CT head: 1. No acute intracranial abnormality or calvarial fracture identified. 2. Moderate chronic microvascular ischemic changes and parenchymal volume loss of the brain. 3. Expansile opacification of the sphenoid sinus with calcifications eroding into the clivus. Findings probably represent a large sinus polyp or mucocele, less likely neoplasm arising from clivus. Nonemergent skull base MRI with and without contrast. CT cervical spine: 1. No acute fracture or dislocation identified. 2. 23 mm nodule within left lobe of thyroid. Nonemergent thyroid ultrasound is recommended. 3. Moderate spondylosis of the cervical spine. Electronically Signed   By: Mitzi HansenLance  Furusawa-Stratton M.D.   On: 07/21/2017 04:24   Ct Head Wo Contrast  Result Date: 07/17/2017 CLINICAL DATA:  Unwitnessed fall.  Dementia EXAM: CT HEAD WITHOUT CONTRAST TECHNIQUE: Contiguous axial images were obtained from the base of the skull through the vertex without intravenous contrast. COMPARISON:  None. FINDINGS: Brain: There is moderate diffuse atrophy. There is no intracranial mass,  hemorrhage, extra-axial fluid collection, or midline shift. There is patchy small vessel disease throughout the centra semiovale bilaterally. Small vessel disease is noted in each thalamus. No acute infarct evident. Vascular: No hyperdense vessels are evident. There is calcification in the distal right vertebral artery as well as in each carotid siphon region. Skull: Bony calvarium appears intact. Sinuses/Orbits: There is opacification in multiple ethmoid air cells, more on the right than on the left. There is complete opacification of sphenoid sinuses bilaterally with areas of calcification in the left sphenoid sinus. There is bony  expansion along the posterior aspect of the sphenoid sinus region. Visualized orbits appear symmetric bilaterally. Other: There is opacification in several mastoid air cells medially on the right. Other visualized paranasal sinuses are clear. There is debris in each external auditory canal. There is extensive degenerative change in the left temporomandibular joint. IMPRESSION: 1. Atrophy with supratentorial small vessel disease. No acute infarct. No mass or hemorrhage. 2.  Foci of arterial vascular calcification. 3. Diffuse opacification of the sphenoid sinuses bilaterally with areas of calcification, likely due to chronic fungal colonization. Expansion of opacification from the sphenoid sinuses posteriorly noted, likely with polypoid change in these areas. No lytic appearing bone lesion evident. There is also fairly extensive ethmoid sinus disease. 4. Mild medial right mastoid disease. Probable cerumen in each external auditory canal. 5. Extensive degenerative change in the left temporomandibular joint. Electronically Signed   By: Bretta Bang III M.D.   On: 07/17/2017 08:06   Ct Cervical Spine Wo Contrast  Result Date: 07/28/2017 CLINICAL DATA:  Status post unwitnessed fall. Headache and back pain. Concern for cervical spine injury. EXAM: CT HEAD WITHOUT CONTRAST CT CERVICAL SPINE WITHOUT CONTRAST TECHNIQUE: Multidetector CT imaging of the head and cervical spine was performed following the standard protocol without intravenous contrast. Multiplanar CT image reconstructions of the cervical spine were also generated. COMPARISON:  CT of the head and cervical spine performed 07/21/2017 FINDINGS: CT HEAD FINDINGS Brain: No evidence of acute infarction, hemorrhage, hydrocephalus, extra-axial collection or mass lesion / mass effect. Prominence of the ventricles and sulci reflects mild to moderate cortical volume loss. Mild cerebellar atrophy is noted. Scattered periventricular and subcortical white matter  change likely reflects small vessel ischemic microangiopathy. The brainstem and fourth ventricle are within normal limits. The basal ganglia are unremarkable in appearance. The cerebral hemispheres demonstrate grossly normal gray-white differentiation. No mass effect or midline shift is seen. Vascular: No hyperdense vessel or unexpected calcification. Skull: There is no evidence of fracture; visualized osseous structures are unremarkable in appearance. Sinuses/Orbits: The orbits are within normal limits. As before, there is opacification of the sphenoid sinus with soft tissue attenuation, amorphous calcification and erosion into the clivus. The remaining paranasal sinuses and mastoid air cells are well-aerated. Other: No significant soft tissue abnormalities are seen. CT CERVICAL SPINE FINDINGS Alignment: Normal. Skull base and vertebrae: No acute fracture. No primary bone lesion or focal pathologic process. Soft tissues and spinal canal: No prevertebral fluid or swelling. No visible canal hematoma. Disc levels: Mild intervertebral disc space narrowing is noted along the cervical spine, with scattered anterior and posterior disc osteophyte complexes. Mild underlying facet disease is noted. Upper chest: Calcification is noted at the carotid bifurcations bilaterally. A 2.4 cm left thyroid nodule is noted. Blebs are noted at the lung apices. There is mild interstitial prominence. Other: No additional soft tissue abnormalities are seen. IMPRESSION: 1. No evidence of traumatic intracranial injury or fracture. 2. No evidence of fracture or subluxation  along the cervical spine. 3. Mild to moderate cortical volume loss and scattered small vessel ischemic microangiopathy. 4. Expansile opacification of the sphenoid sinus again noted, with calcifications, eroding into the clivus. As before, this likely reflects a large sinus polyp or mucocele. Neoplasm is considered less likely, but cannot be excluded. MRI of the skull base  with and without contrast would be helpful for further evaluation, if deemed clinically appropriate. 5. Mild degenerative change along the cervical spine. 6. Calcification at the carotid bifurcations bilaterally. 7. **An incidental finding of potential clinical significance has been found. 2.4 cm left thyroid nodule. Consider further evaluation with thyroid ultrasound. If patient is clinically hyperthyroid, consider nuclear medicine thyroid uptake and scan.** 8. Blebs at the lung apices.  Mild interstitial prominence noted. Electronically Signed   By: Roanna Raider M.D.   On: 07/28/2017 05:21   Ct Cervical Spine Wo Contrast  Result Date: 07/21/2017 CLINICAL DATA:  84 y/o  F; unwitnessed fall, history of dementia. EXAM: CT HEAD WITHOUT CONTRAST CT CERVICAL SPINE WITHOUT CONTRAST TECHNIQUE: Multidetector CT imaging of the head and cervical spine was performed following the standard protocol without intravenous contrast. Multiplanar CT image reconstructions of the cervical spine were also generated. COMPARISON:  07/17/2017 CT head FINDINGS: CT HEAD FINDINGS Brain: No evidence of acute infarction, hemorrhage, hydrocephalus, extra-axial collection or mass lesion/mass effect. Nonspecific foci of hypoattenuation within subcortical and periventricular white matter is compatible with moderate chronic microvascular ischemic changes and there is moderate brain parenchymal volume loss. Vascular: Calcific atherosclerosis of the carotid siphons. No hyperdense vessel. Skull: Normal. Negative for fracture or focal lesion. Sinuses/Orbits: Opacification of the sphenoid sinus with expansile soft tissue attenuation, amorphous calcifications, and erosion into the clivus. Otherwise normal aeration of paranasal sinuses. Normal aeration of mastoid air cells. Bilateral intra-ocular lens replacement. Other: None. CT CERVICAL SPINE FINDINGS Alignment: Straightening of cervical lordosis. C7-T1 grade 1 anterolisthesis. Skull base and  vertebrae: No acute fracture. No primary bone lesion or focal pathologic process. Soft tissues and spinal canal: No prevertebral fluid or swelling. No visible canal hematoma. Disc levels: Moderate cervical spondylosis with multilevel disc and facet degenerative changes. C3-C7 uncovertebral and facet hypertrophy results in bony foraminal stenosis. Multilevel mild to moderate stenosis greatest at the C5-6 level. Upper chest: Right lung apex pleuroparenchymal scarring. Other: Left lobe of thyroid nodule measuring up to 23 mm. Calcific atherosclerosis of the carotid siphons. IMPRESSION: CT head: 1. No acute intracranial abnormality or calvarial fracture identified. 2. Moderate chronic microvascular ischemic changes and parenchymal volume loss of the brain. 3. Expansile opacification of the sphenoid sinus with calcifications eroding into the clivus. Findings probably represent a large sinus polyp or mucocele, less likely neoplasm arising from clivus. Nonemergent skull base MRI with and without contrast. CT cervical spine: 1. No acute fracture or dislocation identified. 2. 23 mm nodule within left lobe of thyroid. Nonemergent thyroid ultrasound is recommended. 3. Moderate spondylosis of the cervical spine. Electronically Signed   By: Mitzi Hansen M.D.   On: 07/21/2017 04:24   Ct Shoulder Left Wo Contrast  Result Date: 07/21/2017 CLINICAL DATA:  Humerus fracture after fall yesterday. EXAM: CT OF THE UPPER LEFT EXTREMITY WITHOUT CONTRAST TECHNIQUE: Multidetector CT imaging of the upper left extremity was performed according to the standard protocol. COMPARISON:  Left shoulder x-rays from same day. FINDINGS: Bones/Joint/Cartilage There is a comminuted, oblique fracture through the surgical neck of the humerus with anterior displacement of the humeral diaphysis thigh approximately 2.0 cm. The humeral head remains opposed  to the glenoid, but is significantly internally rotated and slightly abducted. There is a  large glenohumeral joint effusion. The bones are osteopenic. Ligaments Suboptimally assessed by CT. Muscles and Tendons The rotator cuff is grossly intact.  No significant muscle atrophy. Soft tissues Emphysematous changes in the left lung. Partially visualized enlarged left thyroid lobe containing a 2.4 cm hypodense nodule. IMPRESSION: 1. Comminuted, oblique fracture through the surgical neck of the proximal humerus with approximately 2 cm anterior displacement. 2. No dislocation.  Large glenohumeral joint effusion. Electronically Signed   By: Obie Dredge M.D.   On: 07/21/2017 11:43   Ct Hip Left Wo Contrast  Result Date: 07/17/2017 CLINICAL DATA:  Status post fall.  Left hip pain. EXAM: CT OF THE LEFT HIP WITHOUT CONTRAST TECHNIQUE: Multidetector CT imaging of the left hip was performed according to the standard protocol. Multiplanar CT image reconstructions were also generated. COMPARISON:  None. FINDINGS: Bones/Joint/Cartilage No acute fracture or dislocation. No aggressive osseous lesion. Minimal left hip joint space narrowing. Normal alignment. No joint effusion. Ligaments Ligaments are suboptimally evaluated by CT. Muscles and Tendons Muscles are normal.  No muscle atrophy. Soft tissue No fluid collection or hematoma. No soft tissue mass. Diverticulosis of the sigmoid colon without evidence of diverticulitis. Peripheral vascular atherosclerotic disease. IMPRESSION: No acute osseous injury of the left hip. Electronically Signed   By: Elige Ko   On: 07/17/2017 10:10   Dg Chest Port 1 View  Result Date: 08/03/2017 CLINICAL DATA:  Unwitnessed fall from bed. EXAM: PORTABLE CHEST 1 VIEW COMPARISON:  Scout radiograph from a left shoulder CT scan of July 21, 2017 FINDINGS: The lungs are adequately inflated. The interstitial markings are mildly prominent. The heart is top-normal in size. The pulmonary vascularity is not engorged. There is no pleural effusion. There may be partial compression of the  superior endplate of the body of T4. IMPRESSION: Mild chronic bronchitic changes. No overt pneumonia nor pulmonary edema. Partial compression of the body of T4 is suspected. A thoracic spine series is recommended. Electronically Signed   By: David  [place] M.D.   On: 08/03/2017 07:19   Dg Shoulder Left  Result Date: 07/21/2017 CLINICAL DATA:  Left shoulder pain after unwitnessed fall. EXAM: LEFT SHOULDER - 2+ VIEW COMPARISON:  None. FINDINGS: Significantly displaced fracture through the surgical neck of the humerus with greater than 1 shaft with anterior displacement of the humeral shaft. Glenohumeral alignment is difficult to assess, humeral head likely remains articulating with the glenoid. The acromioclavicular joint is congruent. IMPRESSION: Significantly displaced fracture through the surgical neck of the humerus. Glenohumeral alignment is difficult to assess but likely congruent. Electronically Signed   By: Rubye Oaks M.D.   On: 07/21/2017 04:05   Dg Knee Complete 4 Views Left  Result Date: 07/28/2017 CLINICAL DATA:  Unwitnessed fall at care facility. EXAM: LEFT KNEE - COMPLETE 4+ VIEW; RIGHT KNEE - COMPLETE 4+ VIEW COMPARISON:  None. FINDINGS: LEFT knee: No fracture deformity or dislocation. Mild medial and patellofemoral compartment narrowing. No destructive bony lesions. Osteopenia. Mild vascular calcifications. RIGHT: No acute fracture deformity or dislocation. Moderate tricompartmental joint space narrowing with periarticular sclerosis and mild marginal spurring. Osteopenia without destructive bony lesions. Mild vascular calcifications. IMPRESSION: No acute fracture deformity or dislocation. Mild LEFT and moderate RIGHT osteoarthrosis. Electronically Signed   By: Awilda Metro M.D.   On: 07/28/2017 05:07   Dg Knee Complete 4 Views Right  Result Date: 07/28/2017 CLINICAL DATA:  Unwitnessed fall at care facility. EXAM: LEFT  KNEE - COMPLETE 4+ VIEW; RIGHT KNEE - COMPLETE 4+ VIEW  COMPARISON:  None. FINDINGS: LEFT knee: No fracture deformity or dislocation. Mild medial and patellofemoral compartment narrowing. No destructive bony lesions. Osteopenia. Mild vascular calcifications. RIGHT: No acute fracture deformity or dislocation. Moderate tricompartmental joint space narrowing with periarticular sclerosis and mild marginal spurring. Osteopenia without destructive bony lesions. Mild vascular calcifications. IMPRESSION: No acute fracture deformity or dislocation. Mild LEFT and moderate RIGHT osteoarthrosis. Electronically Signed   By: Awilda Metro M.D.   On: 07/28/2017 05:07   Dg Humerus Left  Result Date: 08/03/2017 CLINICAL DATA:  Fall EXAM: LEFT HUMERUS - 2+ VIEW COMPARISON:  07/21/2017 FINDINGS: Displaced fracture of the left humeral neck unchanged from the prior study. Greater than 1 shaft width of medial displacement of the humerus unchanged. No dislocation. No other fracture identified. IMPRESSION: Displaced left humeral neck fracture unchanged. Electronically Signed   By: Marlan Palau M.D.   On: 08/03/2017 07:22   Dg Hip Unilat With Pelvis 2-3 Views Left  Result Date: 08/03/2017 CLINICAL DATA:  Unwitnessed fall. History of previous left shoulder fracture. EXAM: DG HIP (WITH OR WITHOUT PELVIS) 2-3V LEFT COMPARISON:  Left hip series of July 21, 2017 FINDINGS: The bony pelvis is subjectively adequately mineralized. There is no acute or healing fracture. There is degenerative disc space narrowing in the lower lumbar spine. There is mild degenerative change of the SI joints. There is mild asymmetric joint space loss of both hips. AP and lateral views of the left hip reveal no acute fracture nor dislocation. IMPRESSION: There is diffuse osteopenia subjectively. There is no acute fracture or dislocation. There are degenerative changes as described. Electronically Signed   By: David  [place] M.D.   On: 08/03/2017 07:18   Dg Hip Unilat With Pelvis 2-3 Views Left  Result Date:  07/21/2017 CLINICAL DATA:  84 y/o F; left hip and shoulder pain with unwitnessed fall. EXAM: DG HIP (WITH OR WITHOUT PELVIS) 2-3V LEFT COMPARISON:  07/17/2017 pelvis and left lower extremity radiographs. FINDINGS: There is no evidence of hip fracture or dislocation. Mild osteoarthrosis of the hip joints with loss of joint space and osteophytosis. Vascular calcifications noted. Lower lumbar spondylosis partially visualized. Bones are demineralized. IMPRESSION: No acute bony or articular abnormality identified. Electronically Signed   By: Mitzi Hansen M.D.   On: 07/21/2017 04:04   Dg Hip Unilat W Or Wo Pelvis 2-3 Views Left  Result Date: 07/17/2017 CLINICAL DATA:  Pain following fall EXAM: DG HIP (WITH OR WITHOUT PELVIS) 2-3V LEFT COMPARISON:  None. FINDINGS: Frontal pelvis as well as frontal and lateral left hip images were obtained. There is no evident fracture or dislocation. There is moderate narrowing of both hip joints. Bones are osteoporotic. There is calcification in the right and left common iliac arteries, more notable on the right than on the left. Sacroiliac joints appear normal. IMPRESSION: Moderate narrowing each hip joint. Bones osteoporotic. No acute fracture or dislocation. Electronically Signed   By: Bretta Bang III M.D.   On: 07/17/2017 08:33

## 2017-08-03 NOTE — ED Notes (Signed)
Bedpan removed; pt requested lights out.

## 2017-08-04 ENCOUNTER — Emergency Department: Payer: Medicare Other

## 2017-08-04 ENCOUNTER — Emergency Department
Admission: EM | Admit: 2017-08-04 | Discharge: 2017-08-05 | Disposition: A | Discharge status: Skilled Nursing Facility | Payer: Medicare Other | Attending: Emergency Medicine | Admitting: Emergency Medicine

## 2017-08-04 ENCOUNTER — Encounter: Payer: Self-pay | Admitting: Emergency Medicine

## 2017-08-04 ENCOUNTER — Other Ambulatory Visit: Payer: Self-pay

## 2017-08-04 DIAGNOSIS — Z9181 History of falling: Secondary | ICD-10-CM | POA: Insufficient documentation

## 2017-08-04 DIAGNOSIS — S0990XA Unspecified injury of head, initial encounter: Secondary | ICD-10-CM | POA: Diagnosis not present

## 2017-08-04 DIAGNOSIS — G309 Alzheimer's disease, unspecified: Secondary | ICD-10-CM | POA: Diagnosis not present

## 2017-08-04 DIAGNOSIS — Y999 Unspecified external cause status: Secondary | ICD-10-CM | POA: Insufficient documentation

## 2017-08-04 DIAGNOSIS — Y92129 Unspecified place in nursing home as the place of occurrence of the external cause: Secondary | ICD-10-CM | POA: Insufficient documentation

## 2017-08-04 DIAGNOSIS — Y939 Activity, unspecified: Secondary | ICD-10-CM | POA: Diagnosis not present

## 2017-08-04 DIAGNOSIS — Z79899 Other long term (current) drug therapy: Secondary | ICD-10-CM | POA: Diagnosis not present

## 2017-08-04 DIAGNOSIS — W19XXXA Unspecified fall, initial encounter: Secondary | ICD-10-CM | POA: Insufficient documentation

## 2017-08-04 DIAGNOSIS — M7918 Myalgia, other site: Secondary | ICD-10-CM

## 2017-08-04 DIAGNOSIS — Y929 Unspecified place or not applicable: Secondary | ICD-10-CM | POA: Insufficient documentation

## 2017-08-04 DIAGNOSIS — Z7982 Long term (current) use of aspirin: Secondary | ICD-10-CM | POA: Insufficient documentation

## 2017-08-04 DIAGNOSIS — M791 Myalgia, unspecified site: Secondary | ICD-10-CM | POA: Insufficient documentation

## 2017-08-04 LAB — URINE CULTURE: CULTURE: NO GROWTH

## 2017-08-04 NOTE — ED Triage Notes (Addendum)
[facility]p unwitnessed fall. Pt c/o left hip and left shoulder pain. No blood thinners, although pt is taking aspirin.   Pt has had many recent falls, per EMS. Pt has left shoulder immobilizer that was not on during incident.

## 2017-08-05 DIAGNOSIS — S0990XA Unspecified injury of head, initial encounter: Secondary | ICD-10-CM | POA: Diagnosis not present

## 2017-08-05 LAB — CBC
HCT: 39 % (ref 35.0–47.0)
Hemoglobin: 13.2 g/dL (ref 12.0–16.0)
MCH: 31.1 pg (ref 26.0–34.0)
MCHC: 33.8 g/dL (ref 32.0–36.0)
MCV: 92.1 fL (ref 80.0–100.0)
PLATELETS: 262 10*3/uL (ref 150–440)
RBC: 4.23 MIL/uL (ref 3.80–5.20)
RDW: 13.8 % (ref 11.5–14.5)
WBC: 9.3 10*3/uL (ref 3.6–11.0)

## 2017-08-05 LAB — BASIC METABOLIC PANEL
ANION GAP: 7 (ref 5–15)
BUN: 28 mg/dL — AB (ref 6–20)
CALCIUM: 9.1 mg/dL (ref 8.9–10.3)
CO2: 28 mmol/L (ref 22–32)
Chloride: 103 mmol/L (ref 101–111)
Creatinine, Ser: 0.77 mg/dL (ref 0.44–1.00)
GFR calc Af Amer: >60 mL/min (ref >60)
Glucose, Bld: 114 mg/dL — ABNORMAL HIGH (ref 65–99)
Potassium: 3.9 mmol/L (ref 3.5–5.1)
SODIUM: 138 mmol/L (ref 135–145)

## 2017-08-05 LAB — TROPONIN I: Troponin I: <0.03 ng/mL (ref <0.03)

## 2017-08-05 LAB — URINALYSIS, COMPLETE (UACMP) WITH MICROSCOPIC
BACTERIA UA: NONE SEEN
Bilirubin Urine: NEGATIVE
Glucose, UA: NEGATIVE mg/dL
Hgb urine dipstick: NEGATIVE
Ketones, ur: NEGATIVE mg/dL
Leukocytes, UA: NEGATIVE
NITRITE: NEGATIVE
PH: 6 (ref 5.0–8.0)
Protein, ur: NEGATIVE mg/dL
SPECIFIC GRAVITY, URINE: 1.012 (ref 1.005–1.030)
SQUAMOUS EPITHELIAL / LPF: NONE SEEN (ref 0–5)

## 2017-08-05 NOTE — ED Notes (Signed)
Pt up out of bed, attempting to find restroom. Assisted to toilet and back to bed. Bed alarm in place. Left shoulder immobilized placed back on patient

## 2017-08-05 NOTE — ED Notes (Signed)
Pt up to bathroom with assistance 

## 2017-08-05 NOTE — ED Provider Notes (Addendum)
[facility] Emergency Department Provider Note   ____________________________________________   First MD Initiated Contact with Patient 08/04/17 2327     (approximate)  I have reviewed the triage vital signs and the nursing notes.   HISTORY  Chief Complaint Fall  Patient with history of dementia and unable to participate in history fully  HPI Denise Cantu is a 84 y.o. female who comes into the hospital today after a fall.  The patient states that she fell tonight but is unsure exactly how.  She states that she has some head pain, left hip pain and left arm pain.  The fall was unwitnessed at her nursing home.  She does have a history of your frequent falls with multiple fractures including a chronic left upper extremity fracture.  The patient rates her pain a 10 out of 10 in intensity currently.  She was sent in for evaluation at her nursing home.  When asked about other pains the patient keeps stating that her head hurts and her shoulder hurts and her hip hurts.  The patient is not answering questions well.   Past Medical History:  Diagnosis Date  . Alzheimer's dementia   . Carotid artery stenosis   . DDD (degenerative disc disease), cervical   . HTN (hypertension)   . Hyperlipemia   . Osteopenia   . PVD (peripheral vascular disease) (HCC)   . Vitamin D deficiency     Patient Active Problem List   Diagnosis Date Noted  . Humerus fracture 07/21/2017    Past Surgical History:  Procedure Laterality Date  . CORONARY ANGIOPLASTY      Prior to Admission medications   Medication Sig Start Date End Date Taking? Authorizing Provider  acetaminophen (TYLENOL) 500 MG tablet Take 1,000 mg by mouth 3 (three) times daily.   Yes [provider]  aspirin EC 81 MG tablet Take 81 mg by mouth daily.   Yes [provider]  atorvastatin (LIPITOR) 80 MG tablet Take 80 mg by mouth daily.   Yes [provider]  Calcium Carbonate-Vitamin D  (CALCIUM 600+D) 600-400 MG-UNIT tablet Take 1 tablet by mouth daily.   Yes [provider]  cephALEXin (KEFLEX) 250 MG capsule Take 1 capsule (250 mg total) by mouth 3 (three) times daily for 5 days. 08/03/17 08/08/17 Yes Merrily Brittle, MD  cholecalciferol (VITAMIN D) 1000 units tablet Take 1,000 Units by mouth daily.   Yes [provider]  docusate sodium (COLACE) 100 MG capsule Take 1 capsule (100 mg total) by mouth 2 (two) times daily. 07/22/17  Yes Gouru, Deanna Artis, MD  glucosamine-chondroitin 500-400 MG tablet Take 1 tablet by mouth daily.   Yes [provider]  hydrochlorothiazide (MICROZIDE) 12.5 MG capsule Take 12.5 mg by mouth daily.   Yes [provider]  isosorbide mononitrate (IMDUR) 30 MG 24 hr tablet Take 30 mg by mouth daily.   Yes [provider]  nitroGLYCERIN (NITROSTAT) 0.4 MG SL tablet Place 0.4 mg under the tongue every 5 (five) minutes as needed for chest pain.   Yes [provider]  oxyCODONE (OXY IR/ROXICODONE) 5 MG immediate release tablet Take 1 tablet (5 mg total) by mouth every 4 (four) hours as needed for severe pain or breakthrough pain. 07/22/17  Yes Gouru, Aruna, MD  senna-docusate (SENOKOT-S) 8.6-50 MG tablet Take 1 tablet by mouth at bedtime as needed for mild constipation. 07/22/17  Yes Gouru, Deanna Artis, MD  traZODone (DESYREL) 50 MG tablet Take 25 mg by mouth at  bedtime.   Yes [provider]  albuterol (PROVENTIL) (2.5 MG/3ML) 0.083% nebulizer solution Take 3 mLs (2.5 mg total) by nebulization every 6 (six) hours as needed for wheezing. 07/22/17   Ramonita LabGouru, Aruna, MD    Allergies Patient has no known allergies.  Family History  Problem Relation Age of Onset  . CAD Mother   . CAD Sister   . CAD Brother     Social History Social History   Tobacco Use  . Smoking status: Never Smoker  . Smokeless tobacco: Never Used  Substance Use Topics  . Alcohol use: Never    Frequency: Never  . Drug use: Never     Review of Systems  Unable to answer questions due to history of dementia ____________________________________________   PHYSICAL EXAM:  VITAL SIGNS: ED Triage Vitals  Enc Vitals Group     BP 08/04/17 2251 (!) 188/66     Pulse Rate 08/04/17 2251 75     Resp 08/04/17 2251 18     Temp 08/04/17 2251 97.6 F (36.4 C)     Temp Source 08/04/17 2251 Oral     SpO2 08/04/17 2251 98 %     Weight 08/04/17 2256 175 lb (79.4 kg)     Height 08/04/17 2256 5\' 5"  (1.651 m)     Head Circumference --      Peak Flow --      Pain Score 08/05/17 0037 8     Pain Loc --      Pain Edu? --      Excl. in GC? --     Constitutional: Alert and oriented to self.  Patient in moderate distress Eyes: Conjunctivae are normal. PERRL. EOMI. Head: contusion to left forehead appears old and healing  Nose: No congestion/rhinnorhea. Mouth/Throat: Mucous membranes are moist.  Oropharynx non-erythematous. Neck: No cervical spine tenderness to palpation. Cardiovascular: Normal rate, regular rhythm. Grossly normal heart sounds.  Good peripheral circulation. Respiratory: Normal respiratory effort.  No retractions. Lungs CTAB. Gastrointestinal: Soft and nontender. No distention.  Positive bowel sounds Musculoskeletal: Left shoulder pain and deformity, left hip pain to palpation Neurologic:  Normal speech and language.  Skin:  Skin is warm, dry and intact.  Psychiatric: Mood and affect are normal.   ____________________________________________   LABS (all labs ordered are listed, but only abnormal results are displayed)  Labs Reviewed  BASIC METABOLIC PANEL - Abnormal; Notable for the following components:      Result Value   Glucose, Bld 114 (*)    BUN 28 (*)    All other components within normal limits  URINALYSIS, COMPLETE (UACMP) WITH MICROSCOPIC - Abnormal; Notable for the following components:   Color, Urine STRAW (*)    APPearance CLEAR (*)    All other components within normal limits  CBC   TROPONIN I  TROPONIN I   ____________________________________________  EKG  ED ECG REPORT I, Rebecka ApleyWebster,  Allison P, the attending physician, personally viewed and interpreted this ECG.   Date: 08/04/2017  EKG Time: 2253  Rate: 72  Rhythm: normal sinus rhythm, bigeminy  Axis: left axis deviation  Intervals:incomplete right bundle branch block and left anterior fascicular block  ST&T Change: none  ____________________________________________  RADIOLOGY  ED MD interpretation:  CT head and cervical spine: Age-related involutional changes of the brain with chronic small vessel ischemic disease, no acute intracranial abnormality, cervical spondylolysis without acute cervical spine fracture.  DG left shoulder: chronic left surgical neck fracture with anterior displacement of the humeral shaft, no  new fracture or joint dislocation  DG left hip: Lower lumbar spondylosis  Official radiology report(s): Ct Head Wo Contrast  Result Date: 08/05/2017 CLINICAL DATA:  Patient presents after fall. Currently on aspirin but not on blood thinners. EXAM: CT HEAD WITHOUT CONTRAST CT CERVICAL SPINE WITHOUT CONTRAST TECHNIQUE: Multidetector CT imaging of the head and cervical spine was performed following the standard protocol without intravenous contrast. Multiplanar CT image reconstructions of the cervical spine were also generated. COMPARISON:  08/03/2017 CT head, 07/28/2017 CT cervical spine FINDINGS: CT HEAD FINDINGS BRAIN: There is sulcal and ventricular prominence consistent with age related involutional changes. No intraparenchymal hemorrhage, mass effect nor midline shift. Periventricular and subcortical white matter hypodensities consistent with chronic small vessel ischemic disease are identified. No acute large vascular territory infarcts. No abnormal extra-axial fluid collections. Basal cisterns are not effaced and midline. VASCULAR: Moderate calcific atherosclerosis of the carotid siphons. SKULL:  No skull fracture. No significant scalp soft tissue swelling. SINUSES/ORBITS: The mastoid air-cells are clear. Desiccated mucus within the completely opacified, slightly expanded sphenoid sinus, similar to prior. Minimal ethmoid sinus mucosal thickening. Included ocular globes and orbital contents are non-suspicious. OTHER: None. CT CERVICAL SPINE FINDINGS Alignment: Maintained lordosis. Skull base and vertebrae: No skull base fracture. Soft tissues and spinal canal: No prevertebral fluid or swelling. No visible canal hematoma. Disc levels: Cervical spondylosis with moderate-to-marked disc space flattening from C3 through C7 and with posterior disc-osteophyte complexes as before. Multilevel degenerative facet arthropathy with bilateral C3-4, right C4-5 and bilateral mild C5-6 foraminal stenosis from uncinate spurring due to uncovertebral joint osteoarthritis. Upper chest: Pulmonary blebs at the apices. Bandlike opacity at the right lung apex may represent pulmonary scarring. This is minimally calcified. Atherosclerosis of the carotid bifurcations bilaterally. Dominant thyroid nodule on the left measuring up to 2.3 cm. Other: None IMPRESSION: 1. Age related involutional changes of the brain with chronic small vessel ischemic disease. No acute intracranial abnormality. 2. Chronic sphenoid sinus opacification with mild sinus wall expansion and mucus desiccation, unchanged. 3. Cervical spondylosis without acute cervical spine fracture. 4. 2.3 cm hypodense left thyroid nodule as before. Given patient age and demographics, no additional workup is likely necessary unless clinical situation warrants. Electronically Signed   By: Tollie Eth M.D.   On: 08/05/2017 00:34   Ct Cervical Spine Wo Contrast  Result Date: 08/05/2017 CLINICAL DATA:  Patient presents after fall. Currently on aspirin but not on blood thinners. EXAM: CT HEAD WITHOUT CONTRAST CT CERVICAL SPINE WITHOUT CONTRAST TECHNIQUE: Multidetector CT imaging of  the head and cervical spine was performed following the standard protocol without intravenous contrast. Multiplanar CT image reconstructions of the cervical spine were also generated. COMPARISON:  08/03/2017 CT head, 07/28/2017 CT cervical spine FINDINGS: CT HEAD FINDINGS BRAIN: There is sulcal and ventricular prominence consistent with age related involutional changes. No intraparenchymal hemorrhage, mass effect nor midline shift. Periventricular and subcortical white matter hypodensities consistent with chronic small vessel ischemic disease are identified. No acute large vascular territory infarcts. No abnormal extra-axial fluid collections. Basal cisterns are not effaced and midline. VASCULAR: Moderate calcific atherosclerosis of the carotid siphons. SKULL: No skull fracture. No significant scalp soft tissue swelling. SINUSES/ORBITS: The mastoid air-cells are clear. Desiccated mucus within the completely opacified, slightly expanded sphenoid sinus, similar to prior. Minimal ethmoid sinus mucosal thickening. Included ocular globes and orbital contents are non-suspicious. OTHER: None. CT CERVICAL SPINE FINDINGS Alignment: Maintained lordosis. Skull base and vertebrae: No skull base fracture. Soft tissues and spinal canal: No prevertebral fluid  or swelling. No visible canal hematoma. Disc levels: Cervical spondylosis with moderate-to-marked disc space flattening from C3 through C7 and with posterior disc-osteophyte complexes as before. Multilevel degenerative facet arthropathy with bilateral C3-4, right C4-5 and bilateral mild C5-6 foraminal stenosis from uncinate spurring due to uncovertebral joint osteoarthritis. Upper chest: Pulmonary blebs at the apices. Bandlike opacity at the right lung apex may represent pulmonary scarring. This is minimally calcified. Atherosclerosis of the carotid bifurcations bilaterally. Dominant thyroid nodule on the left measuring up to 2.3 cm. Other: None IMPRESSION: 1. Age related  involutional changes of the brain with chronic small vessel ischemic disease. No acute intracranial abnormality. 2. Chronic sphenoid sinus opacification with mild sinus wall expansion and mucus desiccation, unchanged. 3. Cervical spondylosis without acute cervical spine fracture. 4. 2.3 cm hypodense left thyroid nodule as before. Given patient age and demographics, no additional workup is likely necessary unless clinical situation warrants. Electronically Signed   By: Tollie Eth M.D.   On: 08/05/2017 00:34   Dg Shoulder Left  Result Date: 08/05/2017 CLINICAL DATA:  Unwitnessed fall. Complains of left hip and shoulder pain. EXAM: LEFT SHOULDER - 2+ VIEW COMPARISON:  07/21/2017 FINDINGS: Redemonstration of chronic surgical neck fracture with anterior displacement of the humeral shaft relative to the humeral head and heterotopic new bone formation within the adjacent soft tissues. Humeral head appears to articulate with the glenoid as before. AC joint is maintained. No adjacent rib fracture is noted. Stable pleural thickening at the left lung apex. IMPRESSION: Chronic left surgical neck fracture with anterior displacement of the humeral shaft. No new fracture nor joint dislocation. Electronically Signed   By: Tollie Eth M.D.   On: 08/05/2017 00:20   Dg Hip Unilat W Or Wo Pelvis 2-3 Views Left  Result Date: 08/05/2017 CLINICAL DATA:  Left hip pain after fall. EXAM: DG HIP (WITH OR WITHOUT PELVIS) 2-3V LEFT COMPARISON:  08/03/2017 FINDINGS: There is no evidence of hip fracture or dislocation. Lower lumbar spondylosis is noted with degenerative disc disease and facet arthropathy. The arcuate lines of the sacrum appear symmetric and congruent without fracture identified. Degenerative joint space narrowing of both hips. No of proximal femoral fracture nor suspicious osseous lesions. IMPRESSION: Lower lumbar spondylosis. Osteoarthritis of both hips without acute fracture or joint dislocation. Findings are similar  to prior. Electronically Signed   By: Tollie Eth M.D.   On: 08/05/2017 00:24    ____________________________________________   PROCEDURES  Procedure(s) performed: None  Procedures  Critical Care performed: No  ____________________________________________   INITIAL IMPRESSION / ASSESSMENT AND PLAN / ED COURSE  As part of my medical decision making, I reviewed the following data within the electronic MEDICAL RECORD NUMBER Notes from prior ED visits and [place] Controlled Substance Database   This is a 84 year old female who comes into the hospital today after a fall  The patient has been seen multiple times for falls recently  The patient did have some blood work drawn to include a CBC, BMP, troponin x2 and a urinalysis which were all negative.  We did perform a CT scan of the patient's head, cervical spine and x-rays of her left shoulder and hip.  The patient's left shoulder has a chronic fracture but the remaining images are unremarkable.  The patient has been resting comfortably.  She will be discharged back to her nursing home.      ____________________________________________   FINAL CLINICAL IMPRESSION(S) / ED DIAGNOSES  Final diagnoses:  Fall, initial encounter  Injury of head, initial  encounter  Musculoskeletal pain     ED Discharge Orders    None       Note:  This document was prepared using Dragon voice recognition software and may include unintentional dictation errors.    Rebecka Apley, MD 08/05/17 1610    Rebecka Apley, MD 08/05/17 0530

## 2017-08-05 NOTE — ED Notes (Signed)
Pt unable to sign due to hx of dementia. Pt in NAD at time of departure aems given report.

## 2017-08-05 NOTE — ED Notes (Signed)
[facility]

## 2017-08-05 NOTE — Discharge Instructions (Addendum)
Please follow up with your primary care physician for further evaluation °

## 2017-08-11 ENCOUNTER — Emergency Department: Payer: Medicare Other

## 2017-08-11 ENCOUNTER — Other Ambulatory Visit: Payer: Self-pay

## 2017-08-11 ENCOUNTER — Emergency Department
Admission: EM | Admit: 2017-08-11 | Discharge: 2017-08-11 | Disposition: A | Discharge status: Home / Self Care | Payer: Medicare Other | Attending: Emergency Medicine | Admitting: Emergency Medicine

## 2017-08-11 DIAGNOSIS — R51 Headache: Secondary | ICD-10-CM | POA: Insufficient documentation

## 2017-08-11 DIAGNOSIS — W19XXXA Unspecified fall, initial encounter: Secondary | ICD-10-CM

## 2017-08-11 DIAGNOSIS — I10 Essential (primary) hypertension: Secondary | ICD-10-CM | POA: Insufficient documentation

## 2017-08-11 DIAGNOSIS — Y939 Activity, unspecified: Secondary | ICD-10-CM | POA: Insufficient documentation

## 2017-08-11 DIAGNOSIS — Y92122 Bedroom in nursing home as the place of occurrence of the external cause: Secondary | ICD-10-CM | POA: Diagnosis not present

## 2017-08-11 DIAGNOSIS — Z7982 Long term (current) use of aspirin: Secondary | ICD-10-CM | POA: Diagnosis not present

## 2017-08-11 DIAGNOSIS — Y999 Unspecified external cause status: Secondary | ICD-10-CM | POA: Diagnosis not present

## 2017-08-11 DIAGNOSIS — Z79899 Other long term (current) drug therapy: Secondary | ICD-10-CM | POA: Insufficient documentation

## 2017-08-11 DIAGNOSIS — G308 Other Alzheimer's disease: Secondary | ICD-10-CM | POA: Insufficient documentation

## 2017-08-11 DIAGNOSIS — M25552 Pain in left hip: Secondary | ICD-10-CM | POA: Insufficient documentation

## 2017-08-11 LAB — CBC WITH DIFFERENTIAL/PLATELET
BASOS ABS: 0.1 10*3/uL (ref 0–0.1)
BASOS PCT: 1 %
Eosinophils Absolute: 0.2 10*3/uL (ref 0–0.7)
Eosinophils Relative: 2 %
HCT: 39.8 % (ref 35.0–47.0)
HEMOGLOBIN: 13.5 g/dL (ref 12.0–16.0)
Lymphocytes Relative: 24 %
Lymphs Abs: 2.3 10*3/uL (ref 1.0–3.6)
MCH: 31.2 pg (ref 26.0–34.0)
MCHC: 34 g/dL (ref 32.0–36.0)
MCV: 91.9 fL (ref 80.0–100.0)
Monocytes Absolute: 0.8 10*3/uL (ref 0.2–0.9)
Monocytes Relative: 8 %
NEUTROS ABS: 6.6 10*3/uL — AB (ref 1.4–6.5)
NEUTROS PCT: 65 %
Platelets: 307 10*3/uL (ref 150–440)
RBC: 4.33 MIL/uL (ref 3.80–5.20)
RDW: 13.5 % (ref 11.5–14.5)
WBC: 9.9 10*3/uL (ref 3.6–11.0)

## 2017-08-11 LAB — COMPREHENSIVE METABOLIC PANEL
ALBUMIN: 3.9 g/dL (ref 3.5–5.0)
ALT: 14 U/L (ref 14–54)
AST: 25 U/L (ref 15–41)
Alkaline Phosphatase: 80 U/L (ref 38–126)
Anion gap: 8 (ref 5–15)
BUN: 29 mg/dL — AB (ref 6–20)
CO2: 28 mmol/L (ref 22–32)
CREATININE: 0.9 mg/dL (ref 0.44–1.00)
Calcium: 9 mg/dL (ref 8.9–10.3)
Chloride: 104 mmol/L (ref 101–111)
GFR calc Af Amer: >60 mL/min (ref >60)
GFR, EST NON AFRICAN AMERICAN: 53 mL/min — AB (ref >60)
GLUCOSE: 104 mg/dL — AB (ref 65–99)
POTASSIUM: 4.1 mmol/L (ref 3.5–5.1)
Sodium: 140 mmol/L (ref 135–145)
TOTAL PROTEIN: 6.7 g/dL (ref 6.5–8.1)
Total Bilirubin: 0.7 mg/dL (ref 0.3–1.2)

## 2017-08-11 LAB — URINALYSIS, ROUTINE W REFLEX MICROSCOPIC
Bilirubin Urine: NEGATIVE
GLUCOSE, UA: NEGATIVE mg/dL
Hgb urine dipstick: NEGATIVE
KETONES UR: NEGATIVE mg/dL
LEUKOCYTES UA: NEGATIVE
NITRITE: NEGATIVE
PROTEIN: NEGATIVE mg/dL
Specific Gravity, Urine: 1.009 (ref 1.005–1.030)
pH: 6 (ref 5.0–8.0)

## 2017-08-11 NOTE — ED Provider Notes (Signed)
[facility] Emergency Department Provider Note  ____________________________________________   First MD Initiated Contact with Patient 08/11/17 [phone]     (approximate)  I have reviewed the triage vital signs and the nursing notes.   HISTORY  Chief Complaint Fall  Level 5 caveat:  history/ROS limited by chronic dementia  HPI Denise Cantu is a 84 y.o. female with advanced dementia and chronic medical issues as listed below which includes a pre-existing deformity of her left humerus due to a severe fracture that she received due to a fall.  She has a history of frequent falls and has been to the emergency department 6 times in the last 6 months, usually for falls.  She was brought by EMS tonight for evaluation after an unwitnessed fall.  Her bed alarm went off and when staff responded they found her on the floor.  She is complaining of pain in her left hip but also says that everything hurts.  She has a chronic wound to her left lower leg.  She says that her head hurts and she does not respond about any neck pain in particular.  She seems to be at her baseline mental status and is not in any acute distress, just reports that she hurts.  She did not have any significant reproduction of pain or tenderness when we transferred her from the EMS stretcher to the bed.  She denies shortness of breath and abdominal pain as well as nausea and vomiting.  Past Medical History:  Diagnosis Date  . Alzheimer's dementia   . Carotid artery stenosis   . DDD (degenerative disc disease), cervical   . HTN (hypertension)   . Hyperlipemia   . Osteopenia   . PVD (peripheral vascular disease) (HCC)   . Vitamin D deficiency     Patient Active Problem List   Diagnosis Date Noted  . Humerus fracture 07/21/2017    Past Surgical History:  Procedure Laterality Date  . CORONARY ANGIOPLASTY      Prior to Admission medications   Medication Sig Start Date End Date Taking? Authorizing  Provider  acetaminophen (TYLENOL) 500 MG tablet Take 1,000 mg by mouth 3 (three) times daily.    [provider]  albuterol (PROVENTIL) (2.5 MG/3ML) 0.083% nebulizer solution Take 3 mLs (2.5 mg total) by nebulization every 6 (six) hours as needed for wheezing. 07/22/17   Ramonita Lab, MD  aspirin EC 81 MG tablet Take 81 mg by mouth daily.    [provider]  atorvastatin (LIPITOR) 80 MG tablet Take 80 mg by mouth daily.    [provider]  Calcium Carbonate-Vitamin D (CALCIUM 600+D) 600-400 MG-UNIT tablet Take 1 tablet by mouth daily.    [provider]  cholecalciferol (VITAMIN D) 1000 units tablet Take 1,000 Units by mouth daily.    [provider]  docusate sodium (COLACE) 100 MG capsule Take 1 capsule (100 mg total) by mouth 2 (two) times daily. 07/22/17   Ramonita Lab, MD  glucosamine-chondroitin 500-400 MG tablet Take 1 tablet by mouth daily.    [provider]  hydrochlorothiazide (MICROZIDE) 12.5 MG capsule Take 12.5 mg by mouth daily.    [provider]  isosorbide mononitrate (IMDUR) 30 MG 24 hr tablet Take 30 mg by mouth daily.    [provider]  nitroGLYCERIN (NITROSTAT) 0.4 MG SL tablet Place 0.4 mg under the tongue every 5 (five) minutes as needed for chest pain.    [provider]  oxyCODONE (OXY IR/ROXICODONE) 5  MG immediate release tablet Take 1 tablet (5 mg total) by mouth every 4 (four) hours as needed for severe pain or breakthrough pain. 07/22/17   Gouru, Deanna Artis, MD  senna-docusate (SENOKOT-S) 8.6-50 MG tablet Take 1 tablet by mouth at bedtime as needed for mild constipation. 07/22/17   Ramonita Lab, MD  traZODone (DESYREL) 50 MG tablet Take 25 mg by mouth at bedtime.    [provider]    Allergies Patient has no known allergies.  Family History  Problem Relation Age of Onset  . CAD Mother   . CAD Sister   . CAD Brother     Social History Social History   Tobacco Use  . Smoking  status: Never Smoker  . Smokeless tobacco: Never Used  Substance Use Topics  . Alcohol use: Never    Frequency: Never  . Drug use: Never    Review of Systems Level 5 caveat:  history/ROS limited by chronic dementia ____________________________________________   PHYSICAL EXAM:  VITAL SIGNS: ED Triage Vitals  Enc Vitals Group     BP 08/11/17 0317 (!) 214/87     Pulse Rate 08/11/17 0317 92     Resp 08/11/17 0317 17     Temp 08/11/17 0317 (!) 97.3 F (36.3 C)     Temp Source 08/11/17 0317 Oral     SpO2 08/11/17 0317 98 %     Weight 08/11/17 0320 79.4 kg (175 lb)     Height 08/11/17 0320 1.651 m (5\' 5" )     Head Circumference --      Peak Flow --      Pain Score --      Pain Loc --      Pain Edu? --      Excl. in GC? --     Constitutional: Alert with chronic dementia, answers questions appropriately Eyes: Conjunctivae are normal.  Head: Atraumatic. Nose: No congestion/rhinnorhea. Mouth/Throat: Mucous membranes are moist. Neck: No stridor.  No meningeal signs.  No cervical spine tenderness to palpation. Cardiovascular: Normal rate, regular rhythm. Good peripheral circulation. Grossly normal heart sounds. Respiratory: Normal respiratory effort.  No retractions. Lungs CTAB. Gastrointestinal: Soft and nontender. No distention.  Musculoskeletal: Tenderness to palpation of the left hip but pelvis is stable.  I was able to passively range her left hip without any reproduction of the pain. Neurologic:  Normal speech and language. No gross focal neurologic deficits are appreciated.  Skin:  Skin is warm and dry.  Prior skin tear/wound on left anterior leg with an existing wound care dressing.   ____________________________________________   LABS (all labs ordered are listed, but only abnormal results are displayed)  Labs Reviewed  URINALYSIS, ROUTINE W REFLEX MICROSCOPIC - Abnormal; Notable for the following components:      Result Value   Color, Urine STRAW (*)     APPearance CLEAR (*)    All other components within normal limits  CBC WITH DIFFERENTIAL/PLATELET - Abnormal; Notable for the following components:   Neutro Abs 6.6 (*)    All other components within normal limits  COMPREHENSIVE METABOLIC PANEL - Abnormal; Notable for the following components:   Glucose, Bld 104 (*)    BUN 29 (*)    GFR calc non Af Amer 53 (*)    All other components within normal limits   ____________________________________________  EKG  ED ECG REPORT I, Loleta Rose, the attending physician, personally viewed and interpreted this ECG.  Date: 08/11/2017 EKG Time: 3:22 AM Rate: 86 Rhythm: Occasional supraventricular  bigeminy or couplets QRS Axis: normal Intervals: QTC is 602 today which is longer than on prior EKGs ST/T Wave abnormalities: Non-specific ST segment / T-wave changes, but no evidence of acute ischemia. Narrative Interpretation: no evidence of acute ischemia, overall similar morphology to prior with some nonspecific changes   ____________________________________________  RADIOLOGY I, Loleta Rose, personally viewed and evaluated these images (plain radiographs) as part of my medical decision making, as well as reviewing the written report by the radiologist.  ED MD interpretation: No indication of acute fracture or dislocation on plain films of the hip.  CT scan of head and C-spine are interpreted as unremarkable and unchanged from prior  Official radiology report(s): Ct Head Wo Contrast  Result Date: 08/11/2017 CLINICAL DATA:  Unwitnessed fall. History of dementia, hypertension, and falls. EXAM: CT HEAD WITHOUT CONTRAST CT CERVICAL SPINE WITHOUT CONTRAST TECHNIQUE: Multidetector CT imaging of the head and cervical spine was performed following the standard protocol without intravenous contrast. Multiplanar CT image reconstructions of the cervical spine were also generated. COMPARISON:  08/05/2017 FINDINGS: CT HEAD FINDINGS Brain: No evidence of  acute infarction, hemorrhage, hydrocephalus, extra-axial collection or mass lesion/mass effect. Diffuse cerebral atrophy. Ventricular dilatation consistent with central atrophy. Low-attenuation changes in the deep white matter consistent with small vessel ischemia. Vascular: Intracranial arterial vascular calcifications are present. Skull: Calvarium appears intact. No acute depressed skull fractures. Sinuses/Orbits: Opacification of the sphenoid sinuses with increased density material suggesting chronic sinusitis versus fungal infection. Remaining paranasal sinuses and mastoid air cells are clear. Other: None. CT CERVICAL SPINE FINDINGS Alignment: Slight anterior subluxation at C6-7. No change in alignment since previous study. Normal alignment of the facet joints. C1-2 articulation appears intact. Skull base and vertebrae: Skull base appears intact. No vertebral compression deformities. No focal bone lesion or bone destruction. Soft tissues and spinal canal: No prevertebral soft tissue swelling. No paraspinal mass or soft tissue infiltration. Disc levels: Degenerative changes in the cervical spine with narrowed interspaces and endplate hypertrophic changes, most prominent at C3-4, C4-5, C5-6, and C6-7 levels. Upper chest: Bullous emphysematous changes in the lung apices with scarring in the right apex. Large left thyroid gland nodule measuring 2.1 cm diameter. No change since previous studies. Other: None. IMPRESSION: 1. No acute intracranial abnormalities. Chronic atrophy and small vessel ischemic changes. 2. No change in alignment of the cervical spine since previous study. Diffuse degenerative changes. No acute displaced fractures identified. 3. Left thyroid gland nodule measures 2.1 cm in diameter without change since previous studies. No additional follow-up indicated. 4. Bullous emphysematous changes in the lung apices. Scarring in the right apex. Electronically Signed   By: Burman Nieves M.D.   On:  08/11/2017 04:16   Ct Cervical Spine Wo Contrast  Result Date: 08/11/2017 CLINICAL DATA:  Unwitnessed fall. History of dementia, hypertension, and falls. EXAM: CT HEAD WITHOUT CONTRAST CT CERVICAL SPINE WITHOUT CONTRAST TECHNIQUE: Multidetector CT imaging of the head and cervical spine was performed following the standard protocol without intravenous contrast. Multiplanar CT image reconstructions of the cervical spine were also generated. COMPARISON:  08/05/2017 FINDINGS: CT HEAD FINDINGS Brain: No evidence of acute infarction, hemorrhage, hydrocephalus, extra-axial collection or mass lesion/mass effect. Diffuse cerebral atrophy. Ventricular dilatation consistent with central atrophy. Low-attenuation changes in the deep white matter consistent with small vessel ischemia. Vascular: Intracranial arterial vascular calcifications are present. Skull: Calvarium appears intact. No acute depressed skull fractures. Sinuses/Orbits: Opacification of the sphenoid sinuses with increased density material suggesting chronic sinusitis versus fungal infection. Remaining paranasal sinuses  and mastoid air cells are clear. Other: None. CT CERVICAL SPINE FINDINGS Alignment: Slight anterior subluxation at C6-7. No change in alignment since previous study. Normal alignment of the facet joints. C1-2 articulation appears intact. Skull base and vertebrae: Skull base appears intact. No vertebral compression deformities. No focal bone lesion or bone destruction. Soft tissues and spinal canal: No prevertebral soft tissue swelling. No paraspinal mass or soft tissue infiltration. Disc levels: Degenerative changes in the cervical spine with narrowed interspaces and endplate hypertrophic changes, most prominent at C3-4, C4-5, C5-6, and C6-7 levels. Upper chest: Bullous emphysematous changes in the lung apices with scarring in the right apex. Large left thyroid gland nodule measuring 2.1 cm diameter. No change since previous studies. Other:  None. IMPRESSION: 1. No acute intracranial abnormalities. Chronic atrophy and small vessel ischemic changes. 2. No change in alignment of the cervical spine since previous study. Diffuse degenerative changes. No acute displaced fractures identified. 3. Left thyroid gland nodule measures 2.1 cm in diameter without change since previous studies. No additional follow-up indicated. 4. Bullous emphysematous changes in the lung apices. Scarring in the right apex. Electronically Signed   By: Burman Nieves M.D.   On: 08/11/2017 04:16   Dg Hip Unilat W Or Wo Pelvis 2-3 Views Left  Result Date: 08/11/2017 CLINICAL DATA:  Left hip pain after a fall EXAM: DG HIP (WITH OR WITHOUT PELVIS) 2-3V LEFT COMPARISON:  08/04/2017 FINDINGS: Pelvis and left hip appear intact. No acute displaced fractures identified. SI joints and symphysis pubis are not displaced. Degenerative changes in the lower lumbar spine and in both hips. Vascular calcifications. IMPRESSION: No acute fracture or dislocation demonstrated in the pelvis or left hip. Electronically Signed   By: Burman Nieves M.D.   On: 08/11/2017 04:29    ____________________________________________   PROCEDURES  Critical Care performed: No   Procedure(s) performed:   Procedures   ____________________________________________   INITIAL IMPRESSION / ASSESSMENT AND PLAN / ED COURSE  As part of my medical decision making, I reviewed the following data within the electronic MEDICAL RECORD NUMBER Nursing notes reviewed and incorporated, Labs reviewed , EKG interpreted , Old EKG reviewed, Old chart reviewed and Notes from prior ED visits    Differential diagnosis includes, but is not limited to, mechanical fall, cardiogenic syncope and collapse, infectious process, fracture/dislocation.  The patient was seen multiple times recently and had extensive work-ups.  She has been treated recently for a urinary tract infection but likely is off of antibiotics now and I  will recheck a urinalysis.  Will check basic lab work for any acute metabolic/electrolyte abnormalities.  Her fall out of bed sounds similar to multiple prior incidents and the main issue is making sure there is no evidence of any acute or emergent condition.  She is hypertensive but she was also agitated with a blood pressure was being checked and I looked back through her records and she is always hypertensive although not to this degree.  When she settles down will likely improve.  She is denying any chest pain but reports some pain in the left hip which I will evaluate.  Given her inability to provide an adequate history I will also check a CT scan of her head and cervical spine although she has no tenderness to palpation of the C-spine.  I have a low suspicion for any acute injury based on tonight's unwitnessed fall and anticipate she will be able to be transferred back to her facility.  Clinical Course as of  Aug 11 521  Tue Aug 11, 2017  1610 No acute findings on CT head nor CT C-spine.  Metabolic panel and CBC are all within normal limits and there is no indication of any infection on urinalysis.   [CF]  0438 No fracture/dislocation on hip films.  Called son at phone number in the computer, left message for him to call me back.  I will work towards discharging the patient back to her facility.  DG Hip Unilat W or Wo Pelvis 2-3 Views Left [CF]  [phone] Patient remains stable, hypertensive, but is hypertensive at baseline and BP is likely increased due to the agitation of being brought to the ED.  Appropriate for d/c back to facility.  No call back yet from son/HCPOA.   [CF]  0522 Blood pressure improved back to her baseline  BP(!): 179/84 [CF]    Clinical Course User Index [CF] Loleta Rose, MD    ____________________________________________  FINAL CLINICAL IMPRESSION(S) / ED DIAGNOSES  Final diagnoses:  Fall, initial encounter  Essential hypertension     MEDICATIONS GIVEN DURING THIS  VISIT:  Medications - No data to display   ED Discharge Orders    None       Note:  This document was prepared using Dragon voice recognition software and may include unintentional dictation errors.    Loleta Rose, MD 08/11/17 [phone]

## 2017-08-11 NOTE — ED Notes (Signed)
Pt taken back to facility at this time via EMS. VSS. NAD.

## 2017-08-11 NOTE — ED Triage Notes (Signed)
Pt arrived from [facility] as assisted living facility with complaint of a fall. Staff heard the bed alarm going off, when they went to check pt she was already in the floor, fall was unwittnesed. Pt has Hx of Dementia, HTN, and falls. Pt has complaints of left hip pain. EMS stated there was no shortening. Pt left humerus is broken, but it is an old injury. VS per EMS BP-160 palpated, HR-80 BS-122 O2sat-95%RA. Pt is alert and only oriented to her name.

## 2017-08-11 NOTE — ED Notes (Signed)
CT called and alerted patient is ready for hip scan.

## 2017-08-11 NOTE — Discharge Instructions (Addendum)
Denise Cantu has no evidence of any acute injuries or emergent medical conditions during her evaluation tonight in the emergency department.  Please give her her scheduled medications as planned this morning and have her follow-up with her physician at the next available opportunity.  Return to the emergency department if she develops new or worsening symptoms that concern you.

## 2017-09-08 ENCOUNTER — Other Ambulatory Visit: Payer: Self-pay

## 2017-09-08 ENCOUNTER — Emergency Department

## 2017-09-08 ENCOUNTER — Inpatient Hospital Stay
Admission: EM | Admit: 2017-09-08 | Discharge: 2017-09-11 | DRG: 640 | Disposition: A | Discharge status: Hospice | Attending: Internal Medicine | Admitting: Internal Medicine

## 2017-09-08 ENCOUNTER — Encounter: Payer: Self-pay | Admitting: Emergency Medicine

## 2017-09-08 DIAGNOSIS — Z7982 Long term (current) use of aspirin: Secondary | ICD-10-CM | POA: Diagnosis not present

## 2017-09-08 DIAGNOSIS — M25552 Pain in left hip: Secondary | ICD-10-CM | POA: Diagnosis present

## 2017-09-08 DIAGNOSIS — M25551 Pain in right hip: Secondary | ICD-10-CM | POA: Diagnosis present

## 2017-09-08 DIAGNOSIS — R401 Stupor: Secondary | ICD-10-CM

## 2017-09-08 DIAGNOSIS — Z515 Encounter for palliative care: Secondary | ICD-10-CM

## 2017-09-08 DIAGNOSIS — E785 Hyperlipidemia, unspecified: Secondary | ICD-10-CM | POA: Diagnosis present

## 2017-09-08 DIAGNOSIS — G9340 Encephalopathy, unspecified: Secondary | ICD-10-CM | POA: Diagnosis present

## 2017-09-08 DIAGNOSIS — Z66 Do not resuscitate: Secondary | ICD-10-CM | POA: Diagnosis present

## 2017-09-08 DIAGNOSIS — Z7189 Other specified counseling: Secondary | ICD-10-CM

## 2017-09-08 DIAGNOSIS — F0390 Unspecified dementia without behavioral disturbance: Secondary | ICD-10-CM | POA: Diagnosis not present

## 2017-09-08 DIAGNOSIS — E559 Vitamin D deficiency, unspecified: Secondary | ICD-10-CM | POA: Diagnosis present

## 2017-09-08 DIAGNOSIS — Z6822 Body mass index (BMI) 22.0-22.9, adult: Secondary | ICD-10-CM

## 2017-09-08 DIAGNOSIS — M25512 Pain in left shoulder: Secondary | ICD-10-CM | POA: Diagnosis present

## 2017-09-08 DIAGNOSIS — R339 Retention of urine, unspecified: Secondary | ICD-10-CM | POA: Diagnosis present

## 2017-09-08 DIAGNOSIS — R627 Adult failure to thrive: Secondary | ICD-10-CM | POA: Diagnosis present

## 2017-09-08 DIAGNOSIS — G309 Alzheimer's disease, unspecified: Secondary | ICD-10-CM | POA: Diagnosis present

## 2017-09-08 DIAGNOSIS — Z79899 Other long term (current) drug therapy: Secondary | ICD-10-CM | POA: Diagnosis not present

## 2017-09-08 DIAGNOSIS — M5030 Other cervical disc degeneration, unspecified cervical region: Secondary | ICD-10-CM | POA: Diagnosis present

## 2017-09-08 DIAGNOSIS — G9341 Metabolic encephalopathy: Secondary | ICD-10-CM | POA: Diagnosis present

## 2017-09-08 DIAGNOSIS — Z8249 Family history of ischemic heart disease and other diseases of the circulatory system: Secondary | ICD-10-CM | POA: Diagnosis not present

## 2017-09-08 DIAGNOSIS — F0281 Dementia in other diseases classified elsewhere with behavioral disturbance: Secondary | ICD-10-CM | POA: Diagnosis present

## 2017-09-08 DIAGNOSIS — I2510 Atherosclerotic heart disease of native coronary artery without angina pectoris: Secondary | ICD-10-CM | POA: Diagnosis present

## 2017-09-08 DIAGNOSIS — Z993 Dependence on wheelchair: Secondary | ICD-10-CM

## 2017-09-08 DIAGNOSIS — A159 Respiratory tuberculosis unspecified: Secondary | ICD-10-CM | POA: Diagnosis present

## 2017-09-08 DIAGNOSIS — Z9861 Coronary angioplasty status: Secondary | ICD-10-CM

## 2017-09-08 DIAGNOSIS — I10 Essential (primary) hypertension: Secondary | ICD-10-CM | POA: Diagnosis present

## 2017-09-08 DIAGNOSIS — I6529 Occlusion and stenosis of unspecified carotid artery: Secondary | ICD-10-CM | POA: Diagnosis present

## 2017-09-08 DIAGNOSIS — M8580 Other specified disorders of bone density and structure, unspecified site: Secondary | ICD-10-CM | POA: Diagnosis present

## 2017-09-08 DIAGNOSIS — I739 Peripheral vascular disease, unspecified: Secondary | ICD-10-CM | POA: Diagnosis present

## 2017-09-08 DIAGNOSIS — G8929 Other chronic pain: Secondary | ICD-10-CM | POA: Diagnosis present

## 2017-09-08 DIAGNOSIS — E860 Dehydration: Secondary | ICD-10-CM

## 2017-09-08 LAB — URINALYSIS, COMPLETE (UACMP) WITH MICROSCOPIC
BILIRUBIN URINE: NEGATIVE
GLUCOSE, UA: NEGATIVE mg/dL
Hgb urine dipstick: NEGATIVE
KETONES UR: NEGATIVE mg/dL
LEUKOCYTES UA: NEGATIVE
Nitrite: NEGATIVE
PH: 5 (ref 5.0–8.0)
Protein, ur: NEGATIVE mg/dL
Specific Gravity, Urine: 1.018 (ref 1.005–1.030)

## 2017-09-08 LAB — URINE DRUG SCREEN, QUALITATIVE (ARMC ONLY)
AMPHETAMINES, UR SCREEN: NOT DETECTED
BENZODIAZEPINE, UR SCRN: NOT DETECTED
Barbiturates, Ur Screen: NOT DETECTED
Cannabinoid 50 Ng, Ur ~~LOC~~: NOT DETECTED
Cocaine Metabolite,Ur ~~LOC~~: NOT DETECTED
MDMA (ECSTASY) UR SCREEN: NOT DETECTED
METHADONE SCREEN, URINE: NOT DETECTED
OPIATE, UR SCREEN: NOT DETECTED
Phencyclidine (PCP) Ur S: NOT DETECTED
Tricyclic, Ur Screen: NOT DETECTED

## 2017-09-08 LAB — COMPREHENSIVE METABOLIC PANEL
ALBUMIN: 3.5 g/dL (ref 3.5–5.0)
ALT: 6 U/L — AB (ref 14–54)
AST: 21 U/L (ref 15–41)
Alkaline Phosphatase: 87 U/L (ref 38–126)
Anion gap: 11 (ref 5–15)
BUN: 48 mg/dL — AB (ref 6–20)
CHLORIDE: 103 mmol/L (ref 101–111)
CO2: 24 mmol/L (ref 22–32)
CREATININE: 0.9 mg/dL (ref 0.44–1.00)
Calcium: 8.3 mg/dL — ABNORMAL LOW (ref 8.9–10.3)
GFR calc non Af Amer: 52 mL/min — ABNORMAL LOW (ref >60)
GLUCOSE: 106 mg/dL — AB (ref 65–99)
Potassium: 4.5 mmol/L (ref 3.5–5.1)
SODIUM: 138 mmol/L (ref 135–145)
Total Bilirubin: 1.1 mg/dL (ref 0.3–1.2)
Total Protein: 6.1 g/dL — ABNORMAL LOW (ref 6.5–8.1)

## 2017-09-08 LAB — CBC WITH DIFFERENTIAL/PLATELET
Basophils Absolute: 0.1 10*3/uL (ref 0–0.1)
Basophils Relative: 1 %
EOS ABS: 0.1 10*3/uL (ref 0–0.7)
EOS PCT: 1 %
HCT: 41.8 % (ref 35.0–47.0)
Hemoglobin: 13.8 g/dL (ref 12.0–16.0)
LYMPHS ABS: 2.1 10*3/uL (ref 1.0–3.6)
Lymphocytes Relative: 16 %
MCH: 30.6 pg (ref 26.0–34.0)
MCHC: 33.1 g/dL (ref 32.0–36.0)
MCV: 92.5 fL (ref 80.0–100.0)
MONOS PCT: 8 %
Monocytes Absolute: 1.1 10*3/uL — ABNORMAL HIGH (ref 0.2–0.9)
Neutro Abs: 9.8 10*3/uL — ABNORMAL HIGH (ref 1.4–6.5)
Neutrophils Relative %: 74 %
PLATELETS: 329 10*3/uL (ref 150–440)
RBC: 4.51 MIL/uL (ref 3.80–5.20)
RDW: 13.9 % (ref 11.5–14.5)
WBC: 13.1 10*3/uL — AB (ref 3.6–11.0)

## 2017-09-08 LAB — ACETAMINOPHEN LEVEL: Acetaminophen (Tylenol), Serum: <10 ug/mL — ABNORMAL LOW (ref 10–30)

## 2017-09-08 LAB — SALICYLATE LEVEL: Salicylate Lvl: <7 mg/dL (ref 2.8–30.0)

## 2017-09-08 LAB — ETHANOL: Alcohol, Ethyl (B): <10 mg/dL (ref <10)

## 2017-09-08 LAB — LIPASE, BLOOD: LIPASE: 26 U/L (ref 11–51)

## 2017-09-08 LAB — LACTIC ACID, PLASMA: Lactic Acid, Venous: 1.6 mmol/L (ref 0.5–1.9)

## 2017-09-08 MED ORDER — ALBUTEROL SULFATE (2.5 MG/3ML) 0.083% IN NEBU: 2.5000 mg | INHALATION_SOLUTION | Freq: Four times a day (QID) | RESPIRATORY_TRACT | Status: DC | PRN

## 2017-09-08 MED ORDER — HYDRALAZINE HCL 20 MG/ML IJ SOLN: 10.0000 mg | Freq: Four times a day (QID) | INTRAMUSCULAR | Status: DC | PRN

## 2017-09-08 MED ORDER — ORAL CARE MOUTH RINSE
15.0000 mL | Freq: Two times a day (BID) | OROMUCOSAL | Status: DC
Start: 2017-09-08 — End: 2017-09-12
  Administered 2017-09-08 – 2017-09-11 (×4): 15 mL via OROMUCOSAL

## 2017-09-08 MED ORDER — LABETALOL HCL 5 MG/ML IV SOLN
10.0000 mg | Freq: Once | INTRAVENOUS | Status: AC
  Administered 2017-09-08: 10 mg via INTRAVENOUS
  Filled 2017-09-08: qty 4

## 2017-09-08 MED ORDER — PYRIDOXINE HCL 100 MG/ML IJ SOLN
1500.0000 mg | Freq: Once | INTRAMUSCULAR | Status: DC
  Filled 2017-09-08: qty 15

## 2017-09-08 MED ORDER — ACETAMINOPHEN 325 MG PO TABS: 650.0000 mg | ORAL_TABLET | Freq: Four times a day (QID) | ORAL | Status: DC | PRN

## 2017-09-08 MED ORDER — SODIUM CHLORIDE 0.9 % IV BOLUS
1000.0000 mL | Freq: Once | INTRAVENOUS | Status: AC
  Administered 2017-09-08: 1000 mL via INTRAVENOUS

## 2017-09-08 MED ORDER — IOPAMIDOL (ISOVUE-300) INJECTION 61%
100.0000 mL | Freq: Once | INTRAVENOUS | Status: AC | PRN
  Administered 2017-09-08: 100 mL via INTRAVENOUS

## 2017-09-08 MED ORDER — MORPHINE SULFATE (CONCENTRATE) 10 MG/0.5ML PO SOLN
20.0000 mg | ORAL | Status: DC | PRN
  Administered 2017-09-08 – 2017-09-09 (×2): 20 mg via ORAL
  Filled 2017-09-08 (×2): qty 1

## 2017-09-08 MED ORDER — ENOXAPARIN SODIUM 40 MG/0.4ML ~~LOC~~ SOLN
40.0000 mg | SUBCUTANEOUS | Status: DC
  Administered 2017-09-08 – 2017-09-11 (×3): 40 mg via SUBCUTANEOUS
  Filled 2017-09-08 (×3): qty 0.4

## 2017-09-08 MED ORDER — ONDANSETRON HCL 4 MG/2ML IJ SOLN: 4.0000 mg | Freq: Four times a day (QID) | INTRAMUSCULAR | Status: DC | PRN

## 2017-09-08 MED ORDER — DONEPEZIL HCL 5 MG PO TABS
5.0000 mg | ORAL_TABLET | Freq: Every day | ORAL | Status: DC
  Administered 2017-09-08 – 2017-09-09 (×2): 5 mg via ORAL
  Filled 2017-09-08 (×4): qty 1

## 2017-09-08 MED ORDER — ONDANSETRON HCL 4 MG PO TABS: 4.0000 mg | ORAL_TABLET | Freq: Four times a day (QID) | ORAL | Status: DC | PRN

## 2017-09-08 MED ORDER — PYRIDOXINE HCL 100 MG/ML IJ SOLN
1000.0000 mg | Freq: Once | INTRAMUSCULAR | Status: AC
  Administered 2017-09-08: 1000 mg via INTRAVENOUS
  Filled 2017-09-08: qty 10

## 2017-09-08 MED ORDER — ACETAMINOPHEN 650 MG RE SUPP: 650.0000 mg | Freq: Four times a day (QID) | RECTAL | Status: DC | PRN

## 2017-09-08 MED ORDER — LORAZEPAM 0.5 MG PO TABS: 0.5000 mg | ORAL_TABLET | ORAL | Status: DC | PRN

## 2017-09-08 MED ORDER — SODIUM CHLORIDE 0.9 % IV SOLN
INTRAVENOUS | Status: AC
  Administered 2017-09-08 (×2): via INTRAVENOUS

## 2017-09-08 MED ORDER — POLYETHYLENE GLYCOL 3350 17 G PO PACK: 17.0000 g | PACK | Freq: Every day | ORAL | Status: DC | PRN

## 2017-09-08 NOTE — H&P (Signed)
[facility] [place] [facility]   PATIENT NAME: Denise Cantu    MR#:  [id number]  DATE OF BIRTH:  [birth year]  DATE OF ADMISSION:  09/08/2017  PRIMARY CARE PHYSICIAN: Patient, No Pcp Per   REQUESTING/REFERRING PHYSICIAN: Dr. Scotty Court  CHIEF COMPLAINT:  No chief complaint on file.  witness  HISTORY OF PRESENT ILLNESS:  Denise Cantu  is a 84 y.o. female with a known history of dementia, recurrent falls, latent TB presents to the emergency room due to worsening mental status and not eating or drinking for two days. Patient complained of some abdominal pain earlier and had a CT scan of the abdomen and pelvis checked in the emergency room. Bladder distention was found. Foley catheter place. She has 11 mm stone at the left UV J junction. No hydronephrosis.  Patient is under hospice care and assisted living facility.  PAST MEDICAL HISTORY:   Past Medical History:  Diagnosis Date  . Alzheimer's dementia   . Carotid artery stenosis   . DDD (degenerative disc disease), cervical   . HTN (hypertension)   . Hyperlipemia   . Osteopenia   . PVD (peripheral vascular disease) (HCC)   . Vitamin D deficiency     PAST SURGICAL HISTORY:   Past Surgical History:  Procedure Laterality Date  . CORONARY ANGIOPLASTY      SOCIAL HISTORY:   Social History   Tobacco Use  . Smoking status: Never Smoker  . Smokeless tobacco: Never Used  Substance Use Topics  . Alcohol use: Never    Frequency: Never    FAMILY HISTORY:   Family History  Problem Relation Age of Onset  . CAD Mother   . CAD Sister   . CAD Brother     DRUG ALLERGIES:  No Known Allergies  REVIEW OF SYSTEMS:   Review of Systems  Unable to perform ROS: Mental status change    MEDICATIONS AT HOME:   Prior to Admission medications   Medication Sig Start Date End Date Taking? Authorizing Provider  acetaminophen (TYLENOL) 500 MG tablet Take 1,000 mg by mouth 3 (three) times daily.   Yes [provider]  aspirin EC 81 MG tablet Take 81 mg by mouth daily.   Yes [provider]  atorvastatin (LIPITOR) 40 MG tablet Take 40 mg by mouth daily.    Yes [provider]  Calcium Carbonate-Vitamin D (CALCIUM 600+D) 600-400 MG-UNIT tablet Take 1 tablet by mouth daily.   Yes [provider]  donepezil (ARICEPT) 5 MG tablet Take 5 mg by mouth at bedtime.   Yes [provider]  glucosamine-chondroitin 500-400 MG tablet Take 1 tablet by mouth daily.   Yes [provider]  hydrochlorothiazide (MICROZIDE) 12.5 MG capsule Take 12.5 mg by mouth daily.   Yes [provider]  isoniazid (NYDRAZID) 300 MG tablet Take 900 mg by mouth once a week. FRIDAY   Yes [provider]  LORazepam (ATIVAN) 0.5 MG tablet Take 0.5 mg by mouth every 4 (four) hours as needed for anxiety.   Yes [provider]  Morphine Sulfate (MORPHINE CONCENTRATE) 10 mg / 0.5 ml concentrated solution Take 20 mg by mouth every 2 (two) hours as needed for severe pain.   Yes [provider]  albuterol (PROVENTIL) (2.5 MG/3ML) 0.083% nebulizer solution Take 3 mLs (2.5 mg total) by nebulization every 6 (six) hours as needed for wheezing. 07/22/17   Gouru, Deanna Artis, MD  docusate sodium (COLACE) 100 MG capsule Take 1 capsule (  100 mg total) by mouth 2 (two) times daily. 07/22/17   Gouru, Deanna Artis, MD  nitroGLYCERIN (NITROSTAT) 0.4 MG SL tablet Place 0.4 mg under the tongue every 5 (five) minutes as needed for chest pain.    [provider]  oxyCODONE (OXY IR/ROXICODONE) 5 MG immediate release tablet Take 1 tablet (5 mg total) by mouth every 4 (four) hours as needed for severe pain or breakthrough pain. Patient taking differently: Take 2.5 mg by mouth every 4 (four) hours as needed for severe pain or breakthrough pain.  07/22/17   Gouru, Deanna Artis, MD  senna-docusate (SENOKOT-S) 8.6-50 MG tablet Take 1 tablet by mouth at bedtime as needed for mild constipation. 07/22/17    Ramonita Lab, MD     VITAL SIGNS:  Blood pressure (!) 212/91, pulse 98, temperature 98.3 F (36.8 C), temperature source Axillary, resp. rate (!) 22, height  (1.651 m), weight 59 kg (130 lb), SpO2 95 %.  PHYSICAL EXAMINATION:  Physical Exam  GENERAL:  84 y.o.-year-old patient lying in the bed with no acute distress.  EYES: Pupils equal, round, reactive to light and accommodation. No scleral icterus. Extraocular muscles intact.  HEENT: Head atraumatic, normocephalic. Oropharynx and nasopharynx clear. No oropharyngeal erythema, moist oral mucosa  NECK:  Supple, no jugular venous distention. No thyroid enlargement, no tenderness.  LUNGS: Normal breath sounds bilaterally, no wheezing, rales, rhonchi. No use of accessory muscles of respiration.  CARDIOVASCULAR: S1, S2 normal. No murmurs, rubs, or gallops.  ABDOMEN: Soft, nontender, nondistended. Bowel sounds present. No organomegaly or mass.  EXTREMITIES: No pedal edema, cyanosis, or clubbing. + 2 pedal & radial pulses b/l.   NEUROLOGIC: not following instructions PSYCHIATRIC: The patient is lethargic SKIN: left shin superficial ulcer.  LABORATORY PANEL:   CBC Recent Labs  Lab 09/08/17 0917  WBC 13.1*  HGB 13.8  HCT 41.8  PLT 329   ------------------------------------------------------------------------------------------------------------------  Chemistries  Recent Labs  Lab 09/08/17 0917  NA 138  K 4.5  CL 103  CO2 24  GLUCOSE 106*  BUN 48*  CREATININE 0.90  CALCIUM 8.3*  AST 21  ALT 6*  ALKPHOS 87  BILITOT 1.1   ------------------------------------------------------------------------------------------------------------------  Cardiac Enzymes No results for input(s): TROPONINI in the last 168 hours. ------------------------------------------------------------------------------------------------------------------  RADIOLOGY:  Ct Head Wo Contrast  Result Date: 09/08/2017 CLINICAL DATA:  Altered level of  consciousness, unexplained. EXAM: CT HEAD WITHOUT CONTRAST TECHNIQUE: Contiguous axial images were obtained from the base of the skull through the vertex without intravenous contrast. COMPARISON:  08/11/2017 FINDINGS: Brain: No evidence of acute infarction, hemorrhage, hydrocephalus, extra-axial collection or mass lesion/mass effect. Bitemporal predominant atrophy in this patient with history of Alzheimer's disease. Low-density in the cerebral white matter consistent with chronic small vessel ischemia, stable. Vascular: Atherosclerotic calcification. Skull: No acute or aggressive finding Sinuses/Orbits: There is chronic opacification of the expansile sphenoid sinuses, with effacement of the sphenoid ethmoidal recesses. Chronic broad dehiscence of the posterior left sphenoid wall with smooth margins extending through the clivus. Chronic mineralization within the otherwise fairly low-density opacified sphenoid sinuses. IMPRESSION: 1. No acute finding or change from prior. 2. Chronic small vessel disease. 3. Atrophy in keeping with history of dementia. 4. Chronic sphenoid sinus disease with posterior wall dehiscence. Electronically Signed   By: Marnee Spring M.D.   On: 09/08/2017 13:09   Ct Abdomen Pelvis W Contrast  Result Date: 09/08/2017 CLINICAL DATA:  84 year old female with generalized abdominal pain. Altered mental status since yesterday. Has been on antibiotics for 1 week. EXAM:  CT ABDOMEN AND PELVIS WITH CONTRAST TECHNIQUE: Multidetector CT imaging of the abdomen and pelvis was performed using the standard protocol following bolus administration of intravenous contrast. CONTRAST:  ISOVUE-300 IOPAMIDOL (ISOVUE-300) INJECTION 61% COMPARISON:  Lumbar radiographs 07/28/2017.  Left hip CT 07/17/2017. FINDINGS: Lower chest: Mild cardiomegaly. No pericardial effusion. Calcified aortic atherosclerosis. Multiple chronic and benign appearing cystic areas in the lower lobes, up to about 3 centimeters  diameter). Superimposed subpleural ground-glass opacity is probably atelectasis rather than chronic scarring. No consolidation or pleural effusion at the lung bases. Hepatobiliary: Hepatic steatosis. The gallbladder is mildly distended without pericholecystic inflammation. No biliary ductal enlargement. Pancreas: Atrophied. Spleen: Negative. Adrenals/Urinary Tract: Normal adrenal glands. Bilateral renal enhancement and contrast excretion is symmetric and within normal limits. Both ureters appear symmetric and within normal limits to the bladder. However, inside the urinary bladder located near the left ureterovesical junction there is a large oval 8 x 11 x 9 millimeter calculus (AP by transverse by CC). The urinary bladder is distended with an estimated bladder volume of 505 milliliters. No wall thickening or other bladder abnormality. Stomach/Bowel: Negative rectum. Severe diverticulosis of the sigmoid colon, but no active inflammation identified. Moderate to severe diverticulosis of the descending colon without active inflammation. Mild to moderate diverticulosis of the transverse colon without active inflammation. The right colon is relatively spared. Negative terminal ileum. Diminutive or absent appendix. No dilated small bowel. Small gastric hiatal hernia. Decompressed stomach and duodenum. No abdominal free fluid or free air. Vascular/Lymphatic: Extensive Aortoiliac calcified atherosclerosis. Major arterial structures in the abdomen and pelvis remain patent. There is occlusion of both superficial femoral arteries (series 2, image 79) while the bilateral PFA appear patent. Patent portal venous system. No lymphadenopathy. Reproductive: Negative. Other: No pelvic free fluid. Musculoskeletal: Advanced lumbar disc degeneration with multilevel vacuum disc. Incidental sacral Tarlov cyst(s) and spina bifida occulta. No acute osseous abnormality identified. The left hip appears stable. IMPRESSION: 1. Age indeterminate  occlusion of the bilateral proximal Superficial Femoral Arteries. Correlate with lower extremity pulses and physical exam. Underlying Aortic Atherosclerosis (ICD10-I70.0) and iliofemoral atherosclerosis. The proximal bilateral PFAs remain patent. 2. Large 11 mm calculus within the urinary bladder located near the left UVJ, but no associated ureteral or renal obstruction. Distended urinary bladder, estimated bladder volume 505 mL. 3. Widespread large bowel diverticulosis, severe in the sigmoid colon, but no active inflammation identified. 4. Chronic and benign appearing cystic pulmonary disease at both lung bases. 5. Pancreatic atrophy.  Hepatic steatosis. Electronically Signed   By: Odessa Fleming M.D.   On: 09/08/2017 13:15   Dg Chest Portable 1 View  Result Date: 09/08/2017 CLINICAL DATA:  Altered mental status since yesterday. Recently found to have active TB and has been on antibiotics for the past week. EXAM: PORTABLE CHEST 1 VIEW COMPARISON:  Portable chest x-ray of August 03, 2017 FINDINGS: The lungs are well-expanded and clear. The heart is top-normal in size but stable. The pulmonary vascularity is normal. The mediastinum is normal in width. There is calcification in the wall of the thoracic aorta. There is a chronic non healed fracture of the left femoral neck with deformity. IMPRESSION: No pneumonia, CHF, nor other acute cardiopulmonary abnormality. Thoracic aortic atherosclerosis. Electronically Signed   By: David  Cantu M.D.   On: 09/08/2017 09:26     IMPRESSION AND PLAN:   *Acute metabolic encephalopathy due to likely dehydration. Could also be worsening dementia. Patient has not been eating or drinking for two days. Lethargic. Patient will be admitted  under observation and started on IV fluids. Discussed with son. Patient is under hospice care. If no improvement tomorrow and not eating patient can be transferred to hospice home. Discussed that palliative care. They will set up a family meeting  tomorrow morning with son.  *Urinary retention. Foley catheter place. Patient can be discharged with a Foley catheter. Depending on further plan a discharge may or may not need urology follow-up.  *Dementia. Monitor for inpatient delirium.  *Latent TB. On isoniazid. Does not need isolation.  DVT prophylaxis with Lovenox  All the records are reviewed and case discussed with ED provider. Management plans discussed with the patient, family and they are in agreement.  CODE STATUS: DNR  TOTAL TIME TAKING CARE OF THIS PATIENT: 40 minutes.   Molinda Bailiff Jaben Benegas M.D on 09/08/2017 at 3:07 PM  Between 7am to 6pm - Pager - [phone]  After 6pm go to [email or web address] - password [email or web address]  [facility]  Office  [phone]  CC: Primary care physician; Patient, No Pcp Per  Note: This dictation was prepared with Dragon dictation along with smaller phrase technology. Any transcriptional errors that result from this process are unintentional.

## 2017-09-08 NOTE — ED Notes (Signed)
Son Walter [place] home phone [phone] Daughter in law Claudia [place] [phone]  Son has left and will return before 6PM. Will call before to see if pt has been moved to inpatient room.

## 2017-09-08 NOTE — ED Notes (Signed)
Positive tissue test for negative pressure in room

## 2017-09-08 NOTE — ED Notes (Addendum)
Negative pressure activated in room 6 with alarms active, tissue test completed with negative pressure confirmed.

## 2017-09-08 NOTE — ED Notes (Signed)
This RN called [facility], spoke with Jenne Pane RN, in regards to possible active TB. Jenne Pane RN was able to confirm that pt has been on antibiotics isoniaviv 300 mg (1xweekly) 3 tabs, as well as prisin 150 mg 1xwk (6 tabs). Rn was unable to confirm if it was active TB or lantent TB at this time This RN is waiting on a return call.

## 2017-09-08 NOTE — ED Triage Notes (Signed)
Pt via [facility] from [facility] with ams since yesterday. Son states that she has had antibiotics x 1 week after testing positive for TB. Pt's son had lunch with her on Sunday and she was at her baseline ; she was "zonked out" per son and facility yesterday.

## 2017-09-08 NOTE — Progress Notes (Addendum)
Received a call from Wellness Director at [facility] [place] said that patient was overdosed on her Isoniazide and Pristin on 09/06/14 and they wanted to know if we could run a liver function test

## 2017-09-08 NOTE — ED Notes (Signed)
Isolation precautions discontinued per Ronny Bacon of Infection Prevention

## 2017-09-08 NOTE — ED Notes (Signed)
In and out cath performed for urine

## 2017-09-08 NOTE — ED Provider Notes (Signed)
[facility] Emergency Department Provider Note  ____________________________________________  Time seen: Approximately 2:27 PM  I have reviewed the triage vital signs and the nursing notes.   HISTORY  Chief Complaint No chief complaint on file.  Level 5 Caveat: Portions of the History and Physical were unable to be obtained due to altered mental status and chronic dementia   HPI Denise Cantu is a 84 y.o. female brought to the ED due to altered mental status. The patient has a history of advanced Alzheimer's dementia, hypertension, hyperlipidemia, and her son reports over last several days she has stopped eating or drinking and become quite lethargic. No focal complaints noted by son. None reported by EMS from the memory care.  Patient was recently started on isoniazid for latent tuberculosis. According to her MAR, she is receiving 900 mg once weekly. She was supposed to start a second medicine as well but that has not been available yet.      Past Medical History:  Diagnosis Date  . Alzheimer's dementia   . Carotid artery stenosis   . DDD (degenerative disc disease), cervical   . HTN (hypertension)   . Hyperlipemia   . Osteopenia   . PVD (peripheral vascular disease) (HCC)   . Vitamin D deficiency      Patient Active Problem List   Diagnosis Date Noted  . Humerus fracture 07/21/2017     Past Surgical History:  Procedure Laterality Date  . CORONARY ANGIOPLASTY       Prior to Admission medications   Medication Sig Start Date End Date Taking? Authorizing Provider  acetaminophen (TYLENOL) 500 MG tablet Take 1,000 mg by mouth 3 (three) times daily.   Yes [provider]  aspirin EC 81 MG tablet Take 81 mg by mouth daily.   Yes [provider]  atorvastatin (LIPITOR) 40 MG tablet Take 40 mg by mouth daily.    Yes [provider]  Calcium Carbonate-Vitamin D (CALCIUM 600+D) 600-400 MG-UNIT tablet Take 1 tablet by mouth  daily.   Yes [provider]  donepezil (ARICEPT) 5 MG tablet Take 5 mg by mouth at bedtime.   Yes [provider]  glucosamine-chondroitin 500-400 MG tablet Take 1 tablet by mouth daily.   Yes [provider]  hydrochlorothiazide (MICROZIDE) 12.5 MG capsule Take 12.5 mg by mouth daily.   Yes [provider]  isoniazid (NYDRAZID) 300 MG tablet Take 900 mg by mouth once a week. FRIDAY   Yes [provider]  LORazepam (ATIVAN) 0.5 MG tablet Take 0.5 mg by mouth every 4 (four) hours as needed for anxiety.   Yes [provider]  Morphine Sulfate (MORPHINE CONCENTRATE) 10 mg / 0.5 ml concentrated solution Take 20 mg by mouth every 2 (two) hours as needed for severe pain.   Yes [provider]  albuterol (PROVENTIL) (2.5 MG/3ML) 0.083% nebulizer solution Take 3 mLs (2.5 mg total) by nebulization every 6 (six) hours as needed for wheezing. 07/22/17   Ramonita Lab, MD  docusate sodium (COLACE) 100 MG capsule Take 1 capsule (100 mg total) by mouth 2 (two) times daily. 07/22/17   Gouru, Deanna Artis, MD  nitroGLYCERIN (NITROSTAT) 0.4 MG SL tablet Place 0.4 mg under the tongue every 5 (five) minutes as needed for chest pain.    [provider]  oxyCODONE (OXY IR/ROXICODONE) 5 MG immediate release tablet Take 1 tablet (5 mg total) by mouth every 4 (four) hours as needed for severe pain or breakthrough pain. Patient taking differently:  Take 2.5 mg by mouth every 4 (four) hours as needed for severe pain or breakthrough pain.  07/22/17   Gouru, Deanna Artis, MD  senna-docusate (SENOKOT-S) 8.6-50 MG tablet Take 1 tablet by mouth at bedtime as needed for mild constipation. 07/22/17   Ramonita Lab, MD     Allergies Patient has no known allergies.   Family History  Problem Relation Age of Onset  . CAD Mother   . CAD Sister   . CAD Brother     Social History Social History   Tobacco Use  . Smoking status: Never Smoker  . Smokeless tobacco: Never Used   Substance Use Topics  . Alcohol use: Never    Frequency: Never  . Drug use: Never    Review of Systems Level 5 caveat:  Portions of the history and physical were unable to be obtained due to: altered mental status   ____________________________________________   PHYSICAL EXAM:  VITAL SIGNS: ED Triage Vitals  Enc Vitals Group     BP 09/08/17 0859 (!) 146/124     Pulse Rate 09/08/17 0859 (!) 50     Resp 09/08/17 0859 20     Temp 09/08/17 0859 98.3 F (36.8 C)     Temp Source 09/08/17 0859 Axillary     SpO2 09/08/17 0859 93 %     Weight 09/08/17 0901 130 lb (59 kg)     Height 09/08/17 0901  (1.651 m)     Head Circumference --      Peak Flow --      Pain Score --      Pain Loc --      Pain Edu? --      Excl. in GC? --     Vital signs reviewed, nursing assessments reviewed.   Constitutional:   stuporous. Not meaningfully interactive.ill-appearing Eyes:   Conjunctivae are normal. EOMI. PERRL. ENT      Head:   Normocephalic and atraumatic.      Nose:   No congestion/rhinnorhea.       Mouth/Throat:   dry mucous membranes, no pharyngeal erythema. No peritonsillar mass.       Neck:   No meningismus. Full ROM. Hematological/Lymphatic/Immunilogical:   No cervical lymphadenopathy. Cardiovascular:   RRR. Symmetric bilateral radial and DP pulses.  No murmurs.  Respiratory:   Normal respiratory effort without tachypnea/retractions. Breath sounds are clear and equal bilaterally. No wheezes/rales/rhonchi. Gastrointestinal:   Soft with mild generalized tenderness. Non distended. There is no CVA tenderness.  No rebound, rigidity, or guarding. rectal exam shows small external hemorrhoid that is not thrombosed inflamed or bleeding. Brown stool, Hemoccult negative, controls okay  Musculoskeletal:   Normal range of motion in all extremities. No joint effusions.  No lower extremity tenderness.  No edema. Neurologic:   mumbling incoherent speech.  Motor grossly intact. .  Skin:     Skin is warm, dry and intact. No rash noted.  No petechiae, purpura, or bullae.  ____________________________________________    LABS (pertinent positives/negatives) (all labs ordered are listed, but only abnormal results are displayed) Labs Reviewed  COMPREHENSIVE METABOLIC PANEL - Abnormal; Notable for the following components:      Result Value   Glucose, Bld 106 (*)    BUN 48 (*)    Calcium 8.3 (*)    Total Protein 6.1 (*)    ALT 6 (*)    GFR calc non Af Amer 52 (*)    All other components within normal limits  ACETAMINOPHEN LEVEL - Abnormal;  Notable for the following components:   Acetaminophen (Tylenol), Serum <10 (*)    All other components within normal limits  URINALYSIS, COMPLETE (UACMP) WITH MICROSCOPIC - Abnormal; Notable for the following components:   Color, Urine AMBER (*)    APPearance HAZY (*)    Bacteria, UA RARE (*)    All other components within normal limits  CBC WITH DIFFERENTIAL/PLATELET - Abnormal; Notable for the following components:   WBC 13.1 (*)    Neutro Abs 9.8 (*)    Monocytes Absolute 1.1 (*)    All other components within normal limits  URINE CULTURE  CULTURE, BLOOD (SINGLE)  ETHANOL  SALICYLATE LEVEL  LACTIC ACID, PLASMA  LIPASE, BLOOD  URINE DRUG SCREEN, QUALITATIVE ([facility] ONLY)  LACTIC ACID, PLASMA  QUANTIFERON-TB GOLD PLUS   ____________________________________________   EKG  ____________________________________________    RADIOLOGY  Ct Head Wo Contrast  Result Date: 09/08/2017 CLINICAL DATA:  Altered level of consciousness, unexplained. EXAM: CT HEAD WITHOUT CONTRAST TECHNIQUE: Contiguous axial images were obtained from the base of the skull through the vertex without intravenous contrast. COMPARISON:  08/11/2017 FINDINGS: Brain: No evidence of acute infarction, hemorrhage, hydrocephalus, extra-axial collection or mass lesion/mass effect. Bitemporal predominant atrophy in this patient with history of Alzheimer's disease.  Low-density in the cerebral white matter consistent with chronic small vessel ischemia, stable. Vascular: Atherosclerotic calcification. Skull: No acute or aggressive finding Sinuses/Orbits: There is chronic opacification of the expansile sphenoid sinuses, with effacement of the sphenoid ethmoidal recesses. Chronic broad dehiscence of the posterior left sphenoid wall with smooth margins extending through the clivus. Chronic mineralization within the otherwise fairly low-density opacified sphenoid sinuses. IMPRESSION: 1. No acute finding or change from prior. 2. Chronic small vessel disease. 3. Atrophy in keeping with history of dementia. 4. Chronic sphenoid sinus disease with posterior wall dehiscence. Electronically Signed   By: Marnee Spring M.D.   On: 09/08/2017 13:09   Ct Abdomen Pelvis W Contrast  Result Date: 09/08/2017 CLINICAL DATA:  84 year old female with generalized abdominal pain. Altered mental status since yesterday. Has been on antibiotics for 1 week. EXAM: CT ABDOMEN AND PELVIS WITH CONTRAST TECHNIQUE: Multidetector CT imaging of the abdomen and pelvis was performed using the standard protocol following bolus administration of intravenous contrast. CONTRAST:  ISOVUE-300 IOPAMIDOL (ISOVUE-300) INJECTION 61% COMPARISON:  Lumbar radiographs 07/28/2017.  Left hip CT 07/17/2017. FINDINGS: Lower chest: Mild cardiomegaly. No pericardial effusion. Calcified aortic atherosclerosis. Multiple chronic and benign appearing cystic areas in the lower lobes, up to about 3 centimeters diameter). Superimposed subpleural ground-glass opacity is probably atelectasis rather than chronic scarring. No consolidation or pleural effusion at the lung bases. Hepatobiliary: Hepatic steatosis. The gallbladder is mildly distended without pericholecystic inflammation. No biliary ductal enlargement. Pancreas: Atrophied. Spleen: Negative. Adrenals/Urinary Tract: Normal adrenal glands. Bilateral renal enhancement and  contrast excretion is symmetric and within normal limits. Both ureters appear symmetric and within normal limits to the bladder. However, inside the urinary bladder located near the left ureterovesical junction there is a large oval 8 x 11 x 9 millimeter calculus (AP by transverse by CC). The urinary bladder is distended with an estimated bladder volume of 505 milliliters. No wall thickening or other bladder abnormality. Stomach/Bowel: Negative rectum. Severe diverticulosis of the sigmoid colon, but no active inflammation identified. Moderate to severe diverticulosis of the descending colon without active inflammation. Mild to moderate diverticulosis of the transverse colon without active inflammation. The right colon is relatively spared. Negative terminal ileum. Diminutive or absent appendix. No dilated  small bowel. Small gastric hiatal hernia. Decompressed stomach and duodenum. No abdominal free fluid or free air. Vascular/Lymphatic: Extensive Aortoiliac calcified atherosclerosis. Major arterial structures in the abdomen and pelvis remain patent. There is occlusion of both superficial femoral arteries (series 2, image 79) while the bilateral PFA appear patent. Patent portal venous system. No lymphadenopathy. Reproductive: Negative. Other: No pelvic free fluid. Musculoskeletal: Advanced lumbar disc degeneration with multilevel vacuum disc. Incidental sacral Tarlov cyst(s) and spina bifida occulta. No acute osseous abnormality identified. The left hip appears stable. IMPRESSION: 1. Age indeterminate occlusion of the bilateral proximal Superficial Femoral Arteries. Correlate with lower extremity pulses and physical exam. Underlying Aortic Atherosclerosis (ICD10-I70.0) and iliofemoral atherosclerosis. The proximal bilateral PFAs remain patent. 2. Large 11 mm calculus within the urinary bladder located near the left UVJ, but no associated ureteral or renal obstruction. Distended urinary bladder, estimated bladder  volume 505 mL. 3. Widespread large bowel diverticulosis, severe in the sigmoid colon, but no active inflammation identified. 4. Chronic and benign appearing cystic pulmonary disease at both lung bases. 5. Pancreatic atrophy.  Hepatic steatosis. Electronically Signed   By: Odessa Fleming M.D.   On: 09/08/2017 13:15   Dg Chest Portable 1 View  Result Date: 09/08/2017 CLINICAL DATA:  Altered mental status since yesterday. Recently found to have active TB and has been on antibiotics for the past week. EXAM: PORTABLE CHEST 1 VIEW COMPARISON:  Portable chest x-ray of August 03, 2017 FINDINGS: The lungs are well-expanded and clear. The heart is top-normal in size but stable. The pulmonary vascularity is normal. The mediastinum is normal in width. There is calcification in the wall of the thoracic aorta. There is a chronic non healed fracture of the left femoral neck with deformity. IMPRESSION: No pneumonia, CHF, nor other acute cardiopulmonary abnormality. Thoracic aortic atherosclerosis. Electronically Signed   By: David  Cantu M.D.   On: 09/08/2017 09:26    ____________________________________________   PROCEDURES Procedures  ____________________________________________  DIFFERENTIAL DIAGNOSIS   dehydration, urinary tract infection, pneumonia, stroke, bowel perforation obstruction diverticulitis appendicitis pancreatitis biliary disease urinary tract infection kidney stone  CLINICAL IMPRESSION / ASSESSMENT AND PLAN / ED COURSE  Pertinent labs & imaging results that were available during my care of the patient were reviewed by me and considered in my medical decision making (see chart for details).    patient brought to the ED with altered mental status, some suggestive of abdominal pain on physical exam as well. Differential is broad given her age and comorbidities, start with labs, B6 bolus, hydration.  Clinical Course as of Sep 08 1425  Tue Sep 08, 2017  6045 AMS. Recently started INH for TB. Check  labs, cxr, ua. Possibly need CT head and A/P given recent falls (?SDH) and abd pain on exam. By [facility],  of INH taken. Will give 1 gram b6 IV in case of subclinical seizures while pursuing workup. IVF for hydration.    [PS]  1209 Initial labs unremarkable.  Ua normal. Cxr unremarkable. Will proceed with CT head and abdomen for further eval given lack of diagnosis at this time. BUN elevated c/w dehydration vs  GI bleed. Will check hemoccult.   [PS]  1231 Hemoccult negative. Son updated on results thus far and plan of care. Likely will need to hospitalize today for AMS given lack of reassuring diagnosis.    [PS]  1343 CTs unremarkable except large bladder calculus and urinary retention.    [PS]    Clinical Course User Index [PS] Sharman Cheek, MD     -----------------------------------------  2:31 PM on 09/08/2017 -----------------------------------------  Case discussed with hospitalist for further management. Foley inserted for urinary retention, returning 650 mL of urine.  ____________________________________________   FINAL CLINICAL IMPRESSION(S) / ED DIAGNOSES    Final diagnoses:  Stupor  Dehydration  Urinary retention     ED Discharge Orders    None      Portions of this note were generated with dragon dictation software. Dictation errors may occur despite best attempts at proofreading.    Sharman Cheek, MD 09/08/17 1431

## 2017-09-08 NOTE — Progress Notes (Signed)
Advance care planning  Purpose of Encounter worsening dementia, failure to thrive  Parties in Attendance son - Denise Cantu  Patients Decisional capacity patient is lethargic. Also has dementia. Unable to participate in discussion.  Patient has dementia, recurrent falls and has had significant deterioration over the past 15 months according to the son. She is a resident of assisted living facility. Has deteriorated significantly over the last two days and  not eating or drinking. We discussed regarding dementia being a progressive disease. He mentions that she has hospice services available at this time. Met with hospice services two days back.  [facility] IV fluids at this time. We discussed regarding reassessing patient in the morning. If no worsening and patient is not eating she would be a candidate to be transferred to the hospice home. Son also asked me if she goes back to assisted living facility and this happens again what he should do. Advised to call hospice at that point for further direction. This will most likely happen again with her worsening dementia.  Poor prognosis. Patient is not eating or drinking life expectancy is less than two weeks.  Time spent - 18 minutes

## 2017-09-08 NOTE — Care Management (Signed)
Patient is followed by [facility] hospice at [facility].

## 2017-09-08 NOTE — Progress Notes (Signed)
Spoke to Dr Luberta Mutter she said liver function results looked fine and there is no need to order a liver function panel

## 2017-09-09 DIAGNOSIS — F0390 Unspecified dementia without behavioral disturbance: Secondary | ICD-10-CM

## 2017-09-09 DIAGNOSIS — Z7189 Other specified counseling: Secondary | ICD-10-CM

## 2017-09-09 DIAGNOSIS — Z515 Encounter for palliative care: Secondary | ICD-10-CM

## 2017-09-09 LAB — BASIC METABOLIC PANEL
ANION GAP: 7 (ref 5–15)
BUN: 30 mg/dL — ABNORMAL HIGH (ref 6–20)
CALCIUM: 8.2 mg/dL — AB (ref 8.9–10.3)
CO2: 25 mmol/L (ref 22–32)
Chloride: 108 mmol/L (ref 101–111)
Creatinine, Ser: 0.71 mg/dL (ref 0.44–1.00)
GLUCOSE: 103 mg/dL — AB (ref 65–99)
POTASSIUM: 4 mmol/L (ref 3.5–5.1)
Sodium: 140 mmol/L (ref 135–145)

## 2017-09-09 LAB — CBC
HEMATOCRIT: 37.4 % (ref 35.0–47.0)
HEMOGLOBIN: 12.6 g/dL (ref 12.0–16.0)
MCH: 31.1 pg (ref 26.0–34.0)
MCHC: 33.7 g/dL (ref 32.0–36.0)
MCV: 92.5 fL (ref 80.0–100.0)
Platelets: 260 10*3/uL (ref 150–440)
RBC: 4.05 MIL/uL (ref 3.80–5.20)
RDW: 14.2 % (ref 11.5–14.5)
WBC: 8.6 10*3/uL (ref 3.6–11.0)

## 2017-09-09 LAB — URINE CULTURE

## 2017-09-09 NOTE — [place] FL2 (Signed)
[place] MEDICAID FL2 LEVEL OF CARE SCREENING TOOL     IDENTIFICATION  Patient Name: Allianna Swaz[place]and Birthdate: April 14, 1920 Sex: female Admission Date (Current Location): 09/08/2017  [place] and [place] Number:  Chiropodist and Address:  [facility], [address] [place], [place] [address]      Provider Number: [id number]  Attending Physician Name and Address:  Delfino Lovett, MD  Relative Name and Phone Number:       Current Level of Care: Hospital Recommended Level of Care: Memory Care Prior Approval Number:    Date Approved/Denied:   PASRR Number:    Discharge Plan: Other (Comment)(Memory Care )    Current Diagnoses: Patient Active Problem List   Diagnosis Date Noted  . Acute encephalopathy 09/08/2017  . Humerus fracture 07/21/2017    Orientation RESPIRATION BLADDER Height & Weight     Self  Normal External catheter(Foley Catheter ) Weight: 136 lb 8 oz (61.9 kg) Height:   (165.1 cm)  BEHAVIORAL SYMPTOMS/MOOD NEUROLOGICAL BOWEL NUTRITION STATUS  (None) (None) Continent Diet(Regular)  AMBULATORY STATUS COMMUNICATION OF NEEDS Skin   Limited Assist Verbally Normal                       Personal Care Assistance Level of Assistance  Bathing, Feeding, Dressing Bathing Assistance: Limited assistance Feeding assistance: Independent Dressing Assistance: Limited assistance     Functional Limitations Info  Sight, Hearing, Speech Sight Info: Adequate Hearing Info: Adequate Speech Info: Adequate    SPECIAL CARE FACTORS FREQUENCY                       Contractures Contractures Info: Not present    Additional Factors Info  Code Status, Allergies Code Status Info: DNR Allergies Info: NKA           Current Medications (09/09/2017):  This is the current hospital active medication list Current Facility-Administered Medications  Medication Dose Route Frequency Provider Last Rate Last Dose  . 0.9 %  sodium  chloride infusion   Intravenous Continuous Milagros Loll, MD 100 mL/hr at 09/08/17 2259    . acetaminophen (TYLENOL) tablet 650 mg  650 mg Oral Q6H PRN Milagros Loll, MD       Or  . acetaminophen (TYLENOL) suppository 650 mg  650 mg Rectal Q6H PRN Sudini, Srikar, MD      . albuterol (PROVENTIL) (2.5 MG/3ML) 0.083% nebulizer solution 2.5 mg  2.5 mg Nebulization Q6H PRN Sudini, Srikar, MD      . donepezil (ARICEPT) tablet 5 mg  5 mg Oral QHS Milagros Loll, MD   5 mg at 09/08/17 2258  . enoxaparin (LOVENOX) injection 40 mg  40 mg Subcutaneous Q24H Milagros Loll, MD   40 mg at 09/08/17 2259  . hydrALAZINE (APRESOLINE) injection 10 mg  10 mg Intravenous Q6H PRN Sudini, Wardell Heath, MD      . LORazepam (ATIVAN) tablet 0.5 mg  0.5 mg Oral Q4H PRN Milagros Loll, MD      . MEDLINE mouth rinse  15 mL Mouth Rinse BID Milagros Loll, MD   15 mL at 09/08/17 2301  . morphine CONCENTRATE 10 MG/0.5ML oral solution 20 mg  20 mg Oral Q2H PRN Milagros Loll, MD   20 mg at 09/08/17 2329  . ondansetron (ZOFRAN) tablet 4 mg  4 mg Oral Q6H PRN Sudini, Wardell Heath, MD       Or  . ondansetron (ZOFRAN) injection 4 mg  4 mg Intravenous  Q6H PRN Sudini, Wardell Heath, MD      . polyethylene glycol (MIRALAX / GLYCOLAX) packet 17 g  17 g Oral Daily PRN Milagros Loll, MD         Discharge Medications: Please see discharge summary for a list of discharge medications.  Relevant Imaging Results:  Relevant Lab Results:   Additional Information    Michie Molnar  Rinaldo Ratel, [address]

## 2017-09-09 NOTE — Consult Note (Signed)
WOC Nurse wound consult note Reason for Consult: left lower leg wound and right ankle Wound type: Left lateral lower leg wound etiology is not at all clear to me.  There is a shallow wound that measures 2.8 cm x 1 cm x 0.1 cm with an almost 100% pink base.  It has a foam dressing on it at the time of my assessment in room 117A.  There is minimal drainage on the dressing, and no odor, erythema or induration to surrounding tissue.  The right lateral malleolus has a callus that is intact, without drainage, erythema, or induration to the surrounding tissue.  The area is without a dressing.  This callus appears to be from friction and pressure. Pressure Injury POA: Yes Plan of care:  Left lateral lower leg:  Cleanse with saline; pat dry; apply foam dressing.  Change every 3 days and prn.  Right lateral malleolus:  Keep open to air, protect the callus, monitor for deterioration or worsening. Monitor the wound area(s) for worsening of condition such as: Signs/symptoms of infection,  Increase in size,  Development of or worsening of odor, Development of pain, or increased pain at the affected locations.  Notify the medical team if any of these develop.  Thank you for the consult.  Discussed plan of care with the patient and bedside nurse.  WOC nurse will not follow at this time.  Please re-consult the WOC team if needed.  Helmut Muster, RN, MSN, CWOCN, CNS-BC, pager [phone]

## 2017-09-09 NOTE — Progress Notes (Signed)
   [facility] - [place] at [facility]   PATIENT NAME: Denise Cantu    MR#:  [id number]  DATE OF BIRTH:  [birth year]  SUBJECTIVE:  CHIEF COMPLAINT:  No chief complaint on file. lethargic, unresponsive REVIEW OF SYSTEMS:  Review of Systems  Unable to perform ROS: Mental acuity    DRUG ALLERGIES:  No Known Allergies VITALS:  Blood pressure (!) 168/60, pulse 94, temperature 98.3 F (36.8 C), temperature source Axillary, resp. rate 18, height  (1.651 m), weight 61.9 kg (136 lb 8 oz), SpO2 95 %. PHYSICAL EXAMINATION:  Physical Exam  Constitutional: She appears cachectic. She has a sickly appearance. She appears ill.  HENT:  Head: Normocephalic and atraumatic.  Eyes: Pupils are equal, round, and reactive to light. Conjunctivae and EOM are normal.  Neck: Normal range of motion. Neck supple. No tracheal deviation present. No thyromegaly present.  Cardiovascular: Normal rate, regular rhythm and normal heart sounds.  Pulmonary/Chest: Effort normal and breath sounds normal. No respiratory distress. She has no wheezes. She exhibits no tenderness.  Abdominal: Soft. Bowel sounds are normal. She exhibits no distension. There is no tenderness.  Musculoskeletal: Normal range of motion.  Neurological: She is unresponsive. No cranial nerve deficit.  Skin: Skin is warm and dry. No rash noted.  Psychiatric:  Unable to assess due to lethargy/barely arousable   LABORATORY PANEL:  Female CBC Recent Labs  Lab 09/09/17 0435  WBC 8.6  HGB 12.6  HCT 37.4  PLT 260   ------------------------------------------------------------------------------------------------------------------ Chemistries  Recent Labs  Lab 09/08/17 0917 09/09/17 0435  NA 138 140  K 4.5 4.0  CL 103 108  CO2 24 25  GLUCOSE 106* 103*  BUN 48* 30*  CREATININE 0.90 0.71  CALCIUM 8.3* 8.2*  AST 21  --   ALT 6*  --   ALKPHOS 87  --   BILITOT 1.1  --    RADIOLOGY:  No results found. ASSESSMENT AND  PLAN:   *Acute metabolic encephalopathy due to likely dehydration: Patient is under hospice care. Plan is to send her back to [facility] with Hospice  *Urinary retention D/C Foley catheter.  *Dementia. Monitor for inpatient delirium.  *Latent TB. On isoniazid. Does not need isolation. Son doesn't want to continue meds for this.       All the records are reviewed and case discussed with Care Management/Social Worker. Management plans discussed with the patient, family (son at bedside) and they are in agreement.  CODE STATUS: DNR  TOTAL TIME TAKING CARE OF THIS PATIENT: 35 minutes.   More than 50% of the time was spent in counseling/coordination of care: YES  POSSIBLE D/C IN 1 DAYS, DEPENDING ON CLINICAL CONDITION.   Delfino Lovett M.D on 09/09/2017 at 4:50 PM  Between 7am to 6pm - Pager - [phone]  After 6pm go to [email or web address] - Social research officer, government  [facility] [facility]  Office  [phone]  CC: Primary care physician; [facility]  Note: This dictation was prepared with Dragon dictation along with smaller phrase technology. Any transcriptional errors that result from this process are unintentional.

## 2017-09-09 NOTE — Clinical Social Work Note (Signed)
Clinical Social Work Assessment  Patient Details  Name: Denise Cantu MRN: [id number] Date of Birth: 1919/07/02  Date of referral:  09/09/17               Reason for consult:  Facility Placement                Permission sought to share information with:  Facility Sport and exercise psychologist, Tourist information centre manager, Family Supports Permission granted to share information::  Yes, Verbal Permission Granted  Name::        Agency::     Relationship::     Contact Information:     Housing/Transportation Living arrangements for the past 2 months:  Assisted Living Facility(Memory Care ) Source of Information:  Adult Children Patient Interpreter Needed:  None Criminal Activity/Legal Involvement Pertinent to Current Situation/Hospitalization:  No - Comment as needed Significant Relationships:  Adult Children, Other Family Members Lives with:  Facility Resident Do you feel safe going back to the place where you live?  Yes Need for family participation in patient care:  Yes (Comment)  Care giving concerns:  Patient is a long term resident at [place] Worker assessment / plan:  CSW met with patient's son Wayne Cantu at bedside. Son states that patient moved to [facility] about 6 months ago. Prior to [facility] patient lived at [place]. Son states that patient was having increased falls and they had to move her to [facility]. Son states that patient uses a wheelchair at baseline but the past 3 days she has been weaker and not eating. Son also reports that patient has Hospice services at [facility] through [place]. Patient is confused and unable to be assessed. CSW explained role and that plan is to return to [facility]. Son is in agreement with this. Son also states that he is the only child but does have a granddaughter that sits with patient at night. CSW attempted to contact [facility] but they were not available. CSW will continue to try and  contact [facility]. CSW will continue to follow patient for discharge planning.   Employment status:  Retired Forensic scientist:  Other (Comment Required)([facility] ) PT Recommendations:  Not assessed at this time Information / Referral to community resources:     Patient/Family's Response to care:  Patient is confused and unable to be assessed   Patient/Family's Understanding of and Emotional Response to Diagnosis, Current Treatment, and Prognosis:  Family is very supportive. Son thanked CSW for assistance.   Emotional Assessment Appearance:  Appears stated age Attitude/Demeanor/Rapport:  Lethargic Affect (typically observed):  Unable to Assess Orientation:  Oriented to Self Alcohol / Substance use:  Not Applicable Psych involvement (Current and /or in the community):  No (Comment)  Discharge Needs  Concerns to be addressed:  Discharge Planning Concerns Readmission within the last 30 days:  No Current discharge risk:  Cognitively Impaired Barriers to Discharge:  Continued Medical Work up   [employer], [place] 09/09/2017, 9:38 AM

## 2017-09-09 NOTE — Consult Note (Signed)
Consultation Note Date: 09/09/2017   Patient Name: Denise Cantu  DOB: 28-Nov-1919  MRN: [id number]  Age / Sex: 84 y.o., female  PCP: [facility] Referring Physician: Max Sane, MD  Reason for Consultation: Establishing goals of care  HPI/Patient Profile: 84 y.o. female  with past medical history of dementia, recurrent falls, latent TB, HTN, and HLD admitted on 09/08/2017 with AMS. She had no PO intake for 2 days prior. Bladder distention and 11 mm stone near left UVJ found on CT scan - foley catheter placed. AMS attributed to dehydration and worsening dementia. Patient resides at [facility] and has hospice services through [employer]. PMT consulted for [place].  Clinical Assessment and Goals of Care: I have reviewed medical records including EPIC notes, labs and imaging, received report from bedside RN, assessed the patient and then met at the bedside along with patient's son, Thayer Jew (goes by [place])  to discuss diagnosis prognosis, Simpson, EOL wishes, disposition and options.  Patient unable to participate in conversation d/t altered mental status and advanced dementia.   I introduced Palliative Medicine as specialized medical care for people living with serious illness. It focuses on providing relief from the symptoms and stress of a serious illness. The goal is to improve quality of life for both the patient and the family.  We discussed a brief life review of the patient. Patrick Jupiter tells me the patient worked until she was 84 years old. 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   As far as functional and nutritional status, he speaks of a decline. She is now wheelchair bound and unable to complete ADLs. Her appetite has significantly declined. He tells me she lived at [facility] assisted living for about a year and then had to move due to an  increase in falls. She has been at [facility] for about 6 weeks and during that time has been to the hospital almost once a week. He is frustrated with this.     We discussed their current illness and what it means in the larger context of their on-going co-morbidities.  Natural disease trajectory and expectations at EOL were discussed. Specifically, we discussed progressive nature of dementia and how his mother appears to be nearing the end of life.   I attempted to elicit values and goals of care important to the patient. Patrick Jupiter tells me "she's lived a great life" and he wants to allow the natural process to occur and focus on her comfort and quality of life.     The difference between aggressive medical intervention and comfort care was considered in light of the patient's goals of care. Patrick Jupiter would like to forego any further aggressive medical interventions and focus on his mother's comfort.  Advanced directives, concepts specific to code status, artifical feeding and hydration, and rehospitalization were considered and discussed. Patrick Jupiter tells me he is HCPOA, however I did not see documentation. He does tell me that he is the only child and would be her decision maker. Confirmed DNR. We completed a MOST form together. [facility] elects DNR, comfort measures only, no more rehospitalizations, no IV fluids, no feeding tube, and antibiotics if needed for comfort. He adds that he would like her latent TB treatment stopped.   Hospice and Palliative Care services outpatient were explained and offered. Patient has hospice services and will continue. We discussed when/if residential hospice may be appropriate - at this point does not  seem appropriate as patient is maintaining some PO intake.   Questions and concerns were addressed.The family was encouraged to call with questions or concerns.   Primary Decision Maker NEXT OF KIN/HCPOA - son - Thayer Jew "[place]" Cantu    SUMMARY OF RECOMMENDATIONS    - MOST  completed (copy given to son and placed in chart) - DNR, comfort measures, NO rehospitalization, no IV fluids, no feeding tube, antibiotics if needed for comfort - does not appear to be residential hospice appropriate yet, still maintaining PO intake, although significantly reduced - discussed with family when/if residential would be appropriate - return to facility with hospice services and no reshopitalization - son requests latent TB treatment be discontinued  Code Status/Advance Care Planning:  DNR   Symptom Management:   Per primary team, no symptoms noted during assessment  Additional Recommendations (Limitations, Scope, Preferences):  Avoid Hospitalization, Minimize Medications, No Artificial Feeding and No IV Fluids  Psycho-social/Spiritual:   Desire for further Chaplaincy support:no  Additional Recommendations: Education on Hospice  Prognosis:   < 3 months d/t progressive dementia, declining PO intake, no further aggressive medical interventions  Discharge Planning: ALF with hospice      Primary Diagnoses: Present on Admission: . Acute encephalopathy   I have reviewed the medical record, interviewed the patient and family, and examined the patient. The following aspects are pertinent.  Past Medical History:  Diagnosis Date  . Alzheimer's dementia   . Carotid artery stenosis   . DDD (degenerative disc disease), cervical   . HTN (hypertension)   . Hyperlipemia   . Osteopenia   . PVD (peripheral vascular disease) ([place])   . Vitamin D deficiency    Social History   Socioeconomic History  . Marital status: Single    Spouse name: Not on file  . Number of children: Not on file  . Years of education: Not on file  . Highest education level: Not on file  Occupational History  . Not on file  Social Needs  . Financial resource strain: Not on file  . Food insecurity:    Worry: Not on file    Inability: Not on file  . Transportation needs:    Medical: Not  on file    Non-medical: Not on file  Tobacco Use  . Smoking status: Never Smoker  . Smokeless tobacco: Never Used  Substance and Sexual Activity  . Alcohol use: Never    Frequency: Never  . Drug use: Never  . Sexual activity: Not Currently  Lifestyle  . Physical activity:    Days per week: Not on file    Minutes per session: Not on file  . Stress: Not on file  Relationships  . Social connections:    Talks on phone: Not on file    Gets together: Not on file    Attends religious service: Not on file    Active member of club or organization: Not on file    Attends meetings of clubs or organizations: Not on file    Relationship status: Not on file  Other Topics Concern  . Not on file  Social History Narrative  . Not on file   Family History  Problem Relation Age of Onset  . CAD Mother   . CAD Sister   . CAD Brother    Scheduled Meds: . donepezil  5 mg Oral QHS  . enoxaparin (LOVENOX) injection  40 mg Subcutaneous Q24H  . mouth rinse  15 mL Mouth Rinse BID   Continuous  Infusions: . sodium chloride 100 mL/hr at 09/08/17 2259   PRN Meds:.acetaminophen **OR** acetaminophen, albuterol, hydrALAZINE, LORazepam, morphine CONCENTRATE, ondansetron **OR** ondansetron (ZOFRAN) IV, polyethylene glycol No Known Allergies Review of Systems  Unable to perform ROS: Dementia    Physical Exam  Constitutional: She appears lethargic. No distress.  HENT:  Head: Normocephalic and atraumatic.  Cardiovascular: Normal rate, regular rhythm and intact distal pulses.  Pulmonary/Chest: Effort normal and breath sounds normal. No respiratory distress.  Abdominal: Soft. Bowel sounds are normal.  Musculoskeletal:       Right lower leg: She exhibits edema.       Left lower leg: She exhibits edema.  Wound covered with dressing  Neurological: She appears lethargic. She is disoriented.  Skin: Skin is warm and dry. She is not diaphoretic.  Psychiatric: Cognition and memory are impaired.    Vital  Signs: BP (!) 168/60 (BP Location: Right Arm)   Pulse 94   Temp 98.3 F (36.8 C) (Axillary)   Resp 18   Ht 5' 5" (1.651 m)   Wt 61.9 kg (136 lb 8 oz)   SpO2 95%   BMI 22.71 kg/m  Pain Scale: PAINAD   Pain Score: Asleep   SpO2: SpO2: 95 % O2 Device:SpO2: 95 % O2 Flow Rate: .   IO: Intake/output summary:   Intake/Output Summary (Last 24 hours) at 09/09/2017 1024 Last data filed at 09/09/2017 8599 Gross per 24 hour  Intake 1768.33 ml  Output 1880 ml  Net -111.67 ml    LBM: Last BM Date: (unknown) Baseline Weight: Weight: 59 kg (130 lb) Most recent weight: Weight: 61.9 kg (136 lb 8 oz)     Palliative Assessment/Data: PPS 40%     Time In: 0930 Time Out: 1100 Time Total: 90 minutes Greater than 50%  of this time was spent counseling and coordinating care related to the above assessment and plan.  Juel Burrow, DNP, AGNP-C Palliative Medicine Team [phone]

## 2017-09-09 NOTE — Progress Notes (Signed)
Christy with poison control center called to verify if liver function tests were done and to see if the patient was doing okay.  Said she would call back tomorrow and follow up.

## 2017-09-10 DIAGNOSIS — G9340 Encephalopathy, unspecified: Secondary | ICD-10-CM

## 2017-09-10 MED ORDER — ISONIAZID 300 MG PO TABS
900.0000 mg | ORAL_TABLET | ORAL | Status: DC
  Filled 2017-09-10: qty 3

## 2017-09-10 MED ORDER — MORPHINE SULFATE (CONCENTRATE) 10 MG/0.5ML PO SOLN
10.0000 mg | ORAL | Status: DC | PRN
  Administered 2017-09-11: 10 mg via ORAL
  Filled 2017-09-10: qty 1

## 2017-09-10 NOTE — Clinical Social Work Note (Signed)
Patient is not medically stable to return to [facility]. CSW spoke with patient's son and he would like patient to go to hospice home near [place]. CSW will work on referral to inpatient hospice for patient and continue to follow.   Ruthe Mannan MSW, [address] [phone]

## 2017-09-10 NOTE — Progress Notes (Signed)
Daily Progress Note   Patient Name: Denise Cantu       Date: 09/10/2017 DOB: 09-18-1919  Age: 84 y.o. MRN#: [id number] Attending Physician: Ihor Austin, MD Primary Care Physician: [facility] Admit Date: 09/08/2017  Reason for Consultation/Follow-up: Establishing goals of care and Hospice Evaluation  Subjective: Patient does not respond to me, family at bedside attempting to feed her, patient turns head away refusing PO intake. RN reports patient was unable to take PO medications.  Length of Stay: 2  Current Medications: Scheduled Meds:  . donepezil  5 mg Oral QHS  . enoxaparin (LOVENOX) injection  40 mg Subcutaneous Q24H  . mouth rinse  15 mL Mouth Rinse BID    Continuous Infusions:   PRN Meds: acetaminophen **OR** acetaminophen, albuterol, hydrALAZINE, LORazepam, morphine CONCENTRATE, ondansetron **OR** ondansetron (ZOFRAN) IV, polyethylene glycol  Physical Exam  Constitutional: She appears lethargic. No distress.  Grimaces when awake  Cardiovascular: Normal rate and regular rhythm.  Pulmonary/Chest: Effort normal and breath sounds normal. No accessory muscle usage. No tachypnea. No respiratory distress.  Abdominal: Soft.  Musculoskeletal:       Right lower leg: She exhibits edema.       Left lower leg: She exhibits edema.  Neurological: She appears lethargic. She is disoriented.  weak  Skin: Skin is warm and dry.  Psychiatric: Cognition and memory are impaired.            Vital Signs: BP (!) 176/84 (BP Location: Right Arm)   Pulse (!) 108   Temp 99.8 F (37.7 C) (Axillary)   Resp 18   Ht  (1.651 m)   Wt 61.9 kg (136 lb 8 oz)   SpO2 91%   BMI 22.71 kg/m  SpO2: SpO2: 91 % O2 Device: O2 Device: Room Air O2 Flow Rate:    Intake/output summary:    Intake/Output Summary (Last 24 hours) at 09/10/2017 1020 Last data filed at 09/09/2017 1216 Gross per 24 hour  Intake -  Output 750 ml  Net -750 ml   LBM: Last BM Date: (no BM charted since admission) Baseline Weight: Weight: 59 kg (130 lb) Most recent weight: Weight: 61.9 kg (136 lb 8 oz)       Palliative Assessment/Data: PPS 20%    Flowsheet Rows     Most Recent Value  Intake Tab  Referral Department  Hospitalist  Unit at Time of Referral  Med/Surg Unit  Palliative Care Primary Diagnosis  Neurology  Date Notified  09/08/17  Palliative Care Type  New Palliative care  Reason for referral  Clarify Goals of Care  Date of Admission  09/08/17  Date first seen by Palliative Care  09/09/17  # of days Palliative referral response time  1 Day(s)  # of days IP prior to Palliative referral  0  Clinical Assessment  Palliative Performance Scale Score  40%  Psychosocial & Spiritual Assessment  Palliative Care Outcomes  Patient/Family meeting held?  Yes  Who was at the meeting?  son, Denise Cantu  Palliative Care Outcomes  Clarified goals of care, Provided end of life care assistance, Provided psychosocial or spiritual support, Counseled regarding hospice, Provided advance care planning, Changed to focus on comfort  Patient/Family wishes:  Interventions discontinued/not started   Mechanical Ventilation, Vasopressors, NIPPV, BiPAP, Tube feedings/TPN      Patient Active Problem List   Diagnosis Date Noted  . Dementia without behavioral disturbance   . Goals of care, counseling/discussion   . Palliative care by specialist   . Acute encephalopathy 09/08/2017  . Humerus fracture 07/21/2017    Palliative Care Assessment & Plan   HPI: 84 y.o. female  with past medical history of dementia, recurrent falls, latent TB, HTN, and HLD admitted on 09/08/2017 with AMS. She had no PO intake for 2 days prior. Bladder distention and 11 mm stone near left UVJ found on CT scan - foley catheter placed. AMS  attributed to dehydration and worsening dementia. Patient resides at [facility] and has hospice services through [employer] [facility]. PMT consulted for [place].  Assessment: F/u meeting with family. Today patient is not tolerating PO intake and unable to take PO meds. Minimally responsive. Son is concerned about patient returning to assisted living facility with hospice as it appears she needs total care.   His goals remain the same: to focus on patient's comfort and quality of life, not prolong life, and avoid any further hospitalizations.   He tells me when patient received morphine yesterday it made her very drowsy. Patient has chronic pain in left shoulder and bilateral hips.  Patient's son, Denise Cantu, is interested in residential hospice placement.  Recommendations/Plan:  - MOST completed (copy given to son and placed in chart) - DNR, comfort measures, NO rehospitalization, no IV fluids, no feeding tube, antibiotics if needed for comfort - CSW consult for residential hospice placement  - No PO intake today, unable to take medications, patient minimally responsive - son requests latent TB treatment be discontinued - morphine dosage reduced   Goals of Care and Additional Recommendations:  Limitations on Scope of Treatment: Avoid Hospitalization, Minimize Medications, No Artificial Feeding and No IV Fluids  Code Status:  DNR  Prognosis:   Unable to determine, likely weeks or less d/t patient's current inability to tolerate PO intake  Discharge Planning:  Hospice facility  Care plan was discussed with Dr. Tobi Bastos, bedside RN, son, Denise Cantu  Thank you for allowing the Palliative Medicine Team to assist in the care of this patient.   Total Time 30 minutes Prolonged Time Billed  no       Greater than 50%  of this time was spent counseling and coordinating care related to the above assessment and plan.  Gerlean Ren, DNP, AGNP-C Palliative Medicine Team Team Phone # [phone]

## 2017-09-10 NOTE — Progress Notes (Signed)
SOUND Physicians -  at [facility]   PATIENT NAME: Denise Cantu    MR#:  [id number]  DATE OF BIRTH:  08-11-1919  SUBJECTIVE:  Patient seen and evaluated today Confused and lethargic Received morphine yesterday for pain  REVIEW OF SYSTEMS:    ROS Unable to obtain as patient is confused DRUG ALLERGIES:  No Known Allergies  VITALS:  Blood pressure (!) 176/84, pulse (!) 108, temperature 99.8 F (37.7 C), temperature source Axillary, resp. rate 18, height  (1.651 m), weight 61.9 kg (136 lb 8 oz), SpO2 91 %.  PHYSICAL EXAMINATION:   Physical Exam  GENERAL:  84 y.o.-year-old patient lying in the bed  EYES: Pupils equal, round, reactive to light and accommodation. No scleral icterus. Extraocular muscles intact.  HEENT: Head atraumatic, normocephalic. Oropharynx and nasopharynx clear.  NECK:  Supple, no jugular venous distention. No thyroid enlargement, no tenderness.  LUNGS: Normal breath sounds bilaterally, no wheezing, rales, rhonchi. No use of accessory muscles of respiration.  CARDIOVASCULAR: S1, S2 normal. No murmurs, rubs, or gallops.  ABDOMEN: Soft, nontender, nondistended. Bowel sounds present. No organomegaly or mass.  EXTREMITIES: No cyanosis, clubbing or edema b/l.    NEUROLOGIC: Not oriented to time place and person PSYCHIATRIC: Not be assessed SKIN: No obvious rash, lesion, or ulcer.   LABORATORY PANEL:   CBC Recent Labs  Lab 09/09/17 0435  WBC 8.6  HGB 12.6  HCT 37.4  PLT 260   ------------------------------------------------------------------------------------------------------------------ Chemistries  Recent Labs  Lab 09/08/17 0917 09/09/17 0435  NA 138 140  K 4.5 4.0  CL 103 108  CO2 24 25  GLUCOSE 106* 103*  BUN 48* 30*  CREATININE 0.90 0.71  CALCIUM 8.3* 8.2*  AST 21  --   ALT 6*  --   ALKPHOS 87  --   BILITOT 1.1  --     ------------------------------------------------------------------------------------------------------------------  Cardiac Enzymes No results for input(s): TROPONINI in the last 168 hours. ------------------------------------------------------------------------------------------------------------------  RADIOLOGY:  No results found.   ASSESSMENT AND PLAN:   84 year old elderly female patient with history of Alzheimer's dementia advanced, carotid artery stenosis, degenerative disc disease, hypertension, hyperlipidemia, peripheral vascular disease, vitamin D deficiency was also under hospice care admitted for encephalopathy.  -Acute metabolic encephalopathy Supportive care  -Urinary retention Discontinued Foley catheter  -Advanced dementia with behavioral issues Supportive care  -Latent TB On oral INH   - DVT prophylaxis with [place] lovenox  -Patient's son opted for residential hospice as patient will not be able to take care of herself it may been [facility] assisted facility.  Has poor oral intake, is an  appropriate candidate for residential hospice with advanced age and multiple co morbidities. Consulted Child psychotherapist who is working on placement in a residential hospice in [place].  All the records are reviewed and case discussed with [facility] surgeon. Management plans discussed with the patient, family and they are in agreement.  CODE STATUS: DNR  DVT Prophylaxis: SCDs  TOTAL TIME TAKING CARE OF THIS PATIENT: 34 minutes.   POSSIBLE D/C in 1 DAY, DEPENDING ON CLINICAL CONDITION.  Ihor Austin M.D on 09/10/2017 at 2:33 PM  Between 7am to 6pm - Pager - [phone]  After 6pm go to [email or web address] - password [email or web address]  [facility] [place] [facility]  Office  [phone]  CC: Primary care physician; Housecalls, Doctors Making  Note: This dictation was prepared with Dragon dictation along with smaller phrase technology. Any transcriptional errors that result  from this process are unintentional.

## 2017-09-10 NOTE — Progress Notes (Signed)
Assisted patient to [facility] and chair with assistance of son and NT. Patient unable to stand. Son placed arms around patient and placed her onto [facility] and into chair. After discussing with son, Clinical research associate made SW and MD aware of concerns with patient returning to [facility] AL in current state.   Son at bedside assisting patient to eat breakfast. Bo Mcclintock, RN

## 2017-09-10 NOTE — Progress Notes (Addendum)
SLP Cancellation Note  Patient Details Name: Denise Cantu MRN: [id number] DOB: 07/19/1919   Cancelled treatment:       Reason Eval/Treat Not Completed: (chart reviewed; consulted MD re: pt's status/needs). Noted Son's meeting w/ Palliative Care yesterday and goals of care established. It has also been noted per chart notes that upon attempting to feed her, "patient turns head away refusing PO intake". RN reports patient was unable to take PO medications. Palliative Care noted today "No PO intake today, unable to take medications, patient minimally responsive".  Patient's son has opted for residential hospice home as patient will not be able to take care of herself at [facility] assisted facility. Son wants to "to focus on patient's comfort and quality of life, not prolong life, and avoid any further hospitalizations" per chart notes. Pt has poor oral intake and is an appropriate candidate for residential hospice w/ her advanced age and multiple co- morbidities per notes. Social Worker is working on placement in a residential hospice home in [place] for pt's discharge. MD stated no skilled ST services/needs at this time and agreed for SLP to sign off; MD will reconsult if needs arise.  Recommend aspiration precautions w/ any oral intake; feeding support.    Jerilynn Som, MS, CCC-SLP [place] 09/10/2017, 3:00 PM

## 2017-09-10 NOTE — Progress Notes (Signed)
Patient took 2 bites of bacon and sips of orange juice, assisted by son. Son is aware that patient is more than likely appropriate for hospice and is interested in [facility] in [place], [place]. SW made aware. Bo Mcclintock, RN

## 2017-09-11 NOTE — Progress Notes (Signed)
EMS pick up time changed to 1600 pick up.  Judeth CornfieldStephanie Charity fundraiserN and Ceciliaandace SW notified.

## 2017-09-11 NOTE — Progress Notes (Signed)
Patient discharged to [facility] via EMS transport. Report given to Paramedics and receiving RN at facility. IV left in place per [facility] request/ protocol.

## 2017-09-11 NOTE — Progress Notes (Signed)
Daily Progress Note   Patient Name: Denise Cantu       Date: 09/11/2017 DOB: [birth year]  Age: 84 y.o. MRN#: [id number] Attending Physician: Ihor AustinPyreddy, Pavan, MD Primary Care Physician: Almetta LovelyHousecalls, Doctors Making Admit Date: 09/08/2017  Reason for Consultation/Follow-up: Establishing goals of care and Hospice Evaluation  Subjective: Patient being bathed by staff during my evaluation - not responding to them. Does moan when she is turned. RN reports no PO intake, unable to take PO medications. RN reports periods of apnea.   Length of Stay: 3  Current Medications: Scheduled Meds:  . donepezil  5 mg Oral QHS  . enoxaparin (LOVENOX) injection  40 mg Subcutaneous Q24H  . isoniazid  900 mg Oral Weekly  . mouth rinse  15 mL Mouth Rinse BID    Continuous Infusions:   PRN Meds: acetaminophen **OR** acetaminophen, albuterol, hydrALAZINE, LORazepam, morphine CONCENTRATE, ondansetron **OR** ondansetron (ZOFRAN) IV, polyethylene glycol  Physical Exam  Constitutional: She appears lethargic. She has a sickly appearance.  HENT:  Head: Normocephalic and atraumatic.  Cardiovascular: Normal rate and regular rhythm.  Pulmonary/Chest: Effort normal. No accessory muscle usage. No tachypnea. No respiratory distress.  Abdominal: Soft.  Musculoskeletal:       Right lower leg: She exhibits no edema.       Left lower leg: She exhibits no edema.  Neurological: She appears lethargic. She is disoriented.  Does not follow commands, does not respond to questions  Skin: Skin is warm and dry.  Psychiatric: Cognition and memory are impaired. She is noncommunicative.            Vital Signs: BP (!) 180/70 (BP Location: Left Arm)   Pulse 85   Temp 98.2 F (36.8 C) (Axillary)   Resp 16   Ht 5\' 5"  (1.651 m)   Wt 61.9 kg  (136 lb 8 oz)   SpO2 98%   BMI 22.71 kg/m  SpO2: SpO2: 98 % O2 Device: O2 Device: Room Air O2 Flow Rate:    Intake/output summary:   Intake/Output Summary (Last 24 hours) at 09/11/2017 1111 Last data filed at 09/11/2017 0900 Gross per 24 hour  Intake 240 ml  Output -  Net 240 ml   LBM: Last BM Date: (unknown ) Baseline Weight: Weight: 59 kg (130 lb) Most recent weight: Weight: 61.9 kg (136 lb 8 oz)       Palliative Assessment/Data: PPS 20%    Flowsheet Rows     Most Recent Value  Intake Tab  Referral Department  Hospitalist  Unit at Time of Referral  Med/Surg Unit  Palliative Care Primary Diagnosis  Neurology  Date Notified  09/08/17  Palliative Care Type  New Palliative care  Reason for referral  Clarify Goals of Care  Date of Admission  09/08/17  Date first seen by Palliative Care  09/09/17  # of days Palliative referral response time  1 Day(s)  # of days IP prior to Palliative referral  0  Clinical Assessment  Palliative Performance Scale Score  20%  Psychosocial & Spiritual Assessment  Palliative Care Outcomes  Patient/Family meeting held?  Yes  Who was at the meeting?  [facility], [facility]  Palliative Care Outcomes  Transitioned to hospice  Patient/Family  wishes: Interventions discontinued/not started   Mechanical Ventilation, Vasopressors, NIPPV, BiPAP, Tube feedings/TPN      Patient Active Problem List   Diagnosis Date Noted  . Dementia without behavioral disturbance   . Goals of care, counseling/discussion   . Palliative care by specialist   . Acute encephalopathy 09/08/2017  . Humerus fracture 07/21/2017    Palliative Care Assessment & Plan   HPI: 84 y.o.femalewith past medical history of dementia, recurrent falls, latent TB, HTN, and HLDadmitted on 5/28/2019with AMS.She had no PO intake for 2 days prior. Bladder distention and 11 mm stone near left UVJ found on CT scan - foley catheter placed. AMS attributed to dehydration and worsening dementia.  Patient resides at [facility] and has hospice services through [facility]. PMT consulted for GOC.  Assessment: F/u with patient. She is less responsive today and per nursing, having periods of apnea. I did not witness this during my assessment. RN and family report patient is unable to take POs. Son tells me again he is interested in a residential hospice facility. His goals remain the same: to focus on patient's comfort and quality of life, not prolong life, and avoid any further hospitalizations.   Recommendations/Plan: - MOST completed (copy given to son and placed in chart) - DNR, comfort measures, NO rehospitalization, no IV fluids, no feeding tube, antibiotics if needed for comfort - CSW consult for residential hospice placement  - No PO intake today, unable to take medications, patient minimally responsive - son requests latent TB treatment be discontinued - morphine 10mg  PO q2hr - pt has chronic pain in left shoulder and bilateral hips -mouth care  Goals of Care and Additional Recommendations:  Limitations on Scope of Treatment: Avoid Hospitalization, Minimize Medications, No Artificial Feeding and No IV Fluids  Code Status:  DNR  Prognosis:   < 2 weeks - progressive dementia, no PO intake, minimally responsive  Discharge Planning:  Hospice facility  Care plan was discussed with bedside RN, CSW, patient's family  Thank you for allowing the Palliative Medicine Team to assist in the care of this patient.   Total Time 25 minutes Prolonged Time Billed  no       Greater than 50%  of this time was spent counseling and coordinating care related to the above assessment and plan.  Gerlean Ren, DNP, AGNP-C Palliative Medicine Team Team Phone # [phone]

## 2017-09-11 NOTE — Plan of Care (Signed)

## 2017-09-11 NOTE — Plan of Care (Signed)
  Problem: Education: Goal: Knowledge of General Education information will improve 09/11/2017 1820 by Donnel Saxon, RN Outcome: Progressing 09/11/2017 1818 by Donnel Saxon, RN Outcome: Progressing   Problem: Health Behavior/Discharge Planning: Goal: Ability to manage health-related needs will improve 09/11/2017 1820 by Donnel Saxon, RN Outcome: Progressing 09/11/2017 1818 by Donnel Saxon, RN Outcome: Progressing   Problem: Clinical Measurements: Goal: Ability to maintain clinical measurements within normal limits will improve 09/11/2017 1820 by Donnel Saxon, RN Outcome: Progressing 09/11/2017 1818 by Donnel Saxon, RN Outcome: Progressing Goal: Will remain free from infection 09/11/2017 1820 by Donnel Saxon, RN Outcome: Progressing 09/11/2017 1818 by Donnel Saxon, RN Outcome: Progressing Goal: Diagnostic test results will improve 09/11/2017 1820 by Donnel Saxon, RN Outcome: Progressing 09/11/2017 1818 by Donnel Saxon, RN Outcome: Progressing Goal: Respiratory complications will improve 09/11/2017 1820 by Donnel Saxon, RN Outcome: Progressing 09/11/2017 1818 by Donnel Saxon, RN Outcome: Progressing Goal: Cardiovascular complication will be avoided 09/11/2017 1820 by Donnel Saxon, RN Outcome: Progressing 09/11/2017 1818 by Donnel Saxon, RN Outcome: Progressing   Problem: Activity: Goal: Risk for activity intolerance will decrease 09/11/2017 1820 by Donnel Saxon, RN Outcome: Progressing 09/11/2017 1818 by Donnel Saxon, RN Outcome: Progressing   Problem: Nutrition: Goal: Adequate nutrition will be maintained 09/11/2017 1820 by Donnel Saxon, RN Outcome: Progressing 09/11/2017 1818 by Donnel Saxon, RN Outcome: Progressing   Problem: Coping: Goal: Level of anxiety will decrease 09/11/2017 1820 by Donnel Saxon, RN Outcome:  Progressing 09/11/2017 1818 by Donnel Saxon, RN Outcome: Progressing   Problem: Elimination: Goal: Will not experience complications related to bowel motility 09/11/2017 1820 by Donnel Saxon, RN Outcome: Progressing 09/11/2017 1818 by Donnel Saxon, RN Outcome: Progressing Goal: Will not experience complications related to urinary retention 09/11/2017 1820 by Donnel Saxon, RN Outcome: Progressing 09/11/2017 1818 by Donnel Saxon, RN Outcome: Progressing   Problem: Pain Managment: Goal: General experience of comfort will improve 09/11/2017 1820 by Donnel Saxon, RN Outcome: Progressing 09/11/2017 1818 by Donnel Saxon, RN Outcome: Progressing   Problem: Safety: Goal: Ability to remain free from injury will improve 09/11/2017 1820 by Donnel Saxon, RN Outcome: Progressing 09/11/2017 1818 by Donnel Saxon, RN Outcome: Progressing   Problem: Skin Integrity: Goal: Risk for impaired skin integrity will decrease 09/11/2017 1820 by Donnel Saxon, RN Outcome: Progressing 09/11/2017 1818 by Donnel Saxon, RN Outcome: Progressing

## 2017-09-11 NOTE — Progress Notes (Signed)
New referral for inpatient hospice post palliative medicine consult on 84 year old female who was [facility].  She was admitted with increased altered mental status.  Patient withhistory of dementia with recurrent falls, HTN, HLD and laten TB- with recent PO antibiotic tx.  Patient's PPS yesterday was 40% and today is 20%.  She is only taking in sips and bites, is confused and agitated.  I spoke with the patient's son, Chrissie NoaWilliam regarding inpatient hospice services.  He is in agreement with and request transport to the hospice home today.  He is aware hospice home focuses on comfort care and no aggressive treatment.  Per the RN, the patient is not able to swallow pills today.  Ms. Swaz[place]Jordan is lying in bed her right side, moaning. Trying talk, but it is difficult to make out her words.  She is rubbing her hip and rocking back and forth in the bed.  When asked if she hurts, she replies with yes.  Son states chronic left hip pain due to multiple falls.  Notified Judeth CornfieldStephanie, RN who gave her 10mg  SL Roxanol.  Consents signed and faxed to referral intake along with referral information.  Plan for patient to transport to the hospice home via EMS with 1430 pick up time being arranged.  Thank you for allowing participation in this patient's care.  Dolores HooseKara Hodge Marshall, MA, BSN, RN, FNE [facility]

## 2017-09-11 NOTE — Clinical Social Work Note (Signed)
Patient has been accepted at [facility] today. Patient can be discharged to hospice home today. CSW notified patient's son Denise Cantu in room. CSW also notified MD and RN. RN will call report. Patient will be transported via EMS.   Ruthe Mannan MSW, [address] [phone]

## 2017-09-11 NOTE — Discharge Summary (Signed)
SOUND Physicians - [place] at [facility]   PATIENT NAME: Denise Cantu    MR#:  [id number]  DATE OF BIRTH:  09/02/1919  DATE OF ADMISSION:  09/08/2017 ADMITTING PHYSICIAN: Milagros Loll, MD  DATE OF DISCHARGE: No discharge date for patient encounter.  PRIMARY CARE PHYSICIAN: Housecalls, Doctors Making   ADMISSION DIAGNOSIS:  Dehydration [E86.0] Urinary retention [R33.9] Stupor [R40.1]  DISCHARGE DIAGNOSIS:  Acute metabolic encephalopathy Advanced dementia Urinary retention Latent tuberculosis Adult failure to thrive  SECONDARY DIAGNOSIS:   Past Medical History:  Diagnosis Date  . Alzheimer's dementia   . Carotid artery stenosis   . DDD (degenerative disc disease), cervical   . HTN (hypertension)   . Hyperlipemia   . Osteopenia   . PVD (peripheral vascular disease) (HCC)   . Vitamin D deficiency      ADMITTING HISTORY Denise Cantu  is a 84 y.o. female with a known history of dementia, recurrent falls, latent TB presents to the emergency room due to worsening mental status and not eating or drinking for two days. Patient complained of some abdominal pain earlier and had a CT scan of the abdomen and pelvis checked in the emergency room. Bladder distention was found. Foley catheter place. She has 11 mm stone at the left UV J junction. No hydronephrosis. Patient is under hospice care and assisted living facility.  HOSPITAL COURSE:  Patient was admitted to medical floor.  Patient was started on IV fluids.  Patient had poor oral intake during her hospitalization.  She is very confused and not oriented to time place and person.  She had a Foley catheter for urinary retention.  Patient's family does not want any aggressive measures.  They opted for comfort measures.  They wanted patient to be discharged to residential hospice for comfort measures.  Very elderly patient with comorbidities and poor prognosis.  Patient has a bed at [facility] hospice and will be discharged  today to residential hospice.  CONSULTS OBTAINED:    DRUG ALLERGIES:  No Known Allergies  DISCHARGE MEDICATIONS:   Allergies as of 09/11/2017   No Known Allergies     Medication List    STOP taking these medications   acetaminophen 500 MG tablet Commonly known as:  TYLENOL   albuterol (2.5 MG/3ML) 0.083% nebulizer solution Commonly known as:  PROVENTIL   aspirin EC 81 MG tablet   atorvastatin 40 MG tablet Commonly known as:  LIPITOR   CALCIUM 600+D 600-400 MG-UNIT tablet Generic drug:  Calcium Carbonate-Vitamin D   docusate sodium 100 MG capsule Commonly known as:  COLACE   donepezil 5 MG tablet Commonly known as:  ARICEPT   glucosamine-chondroitin 500-400 MG tablet   hydrochlorothiazide 12.5 MG capsule Commonly known as:  MICROZIDE   isoniazid 300 MG tablet Commonly known as:  NYDRAZID   LORazepam 0.5 MG tablet Commonly known as:  ATIVAN   morphine CONCENTRATE 10 mg / 0.5 ml concentrated solution   nitroGLYCERIN 0.4 MG SL tablet Commonly known as:  NITROSTAT   oxyCODONE 5 MG immediate release tablet Commonly known as:  Oxy IR/ROXICODONE   senna-docusate 8.6-50 MG tablet Commonly known as:  Senokot-S      Discharge Meds Ativan 1mg  orally q 4 hrly prn for agitation/anxiety  Today  Patient seen and evaluated today Has very poor oral intake Not oriented to time place and person Lethargic and sleepy  VITAL SIGNS:  Blood pressure (!) 180/70, pulse 85, temperature 98.2 F (36.8 C), temperature source Axillary, resp. rate 16,  height 5\' 5"  (1.651 m), weight 61.9 kg (136 lb 8 oz), SpO2 98 %.  I/O:    Intake/Output Summary (Last 24 hours) at 09/11/2017 1255 Last data filed at 09/11/2017 0900 Gross per 24 hour  Intake 240 ml  Output -  Net 240 ml    PHYSICAL EXAMINATION:  Physical Exam  GENERAL:  84 y.o.-year-old patient lying in the bed with no acute distress.  LUNGS: Normal breath sounds bilaterally, no wheezing, rales,rhonchi or  crepitation. No use of accessory muscles of respiration.  CARDIOVASCULAR: S1, S2 normal. No murmurs, rubs, or gallops.  ABDOMEN: Soft, non-tender, non-distended. Bowel sounds present. No organomegaly or mass.  NEUROLOGIC: Not oriented to time place and person PSYCHIATRIC: could not be assessed SKIN: No obvious rash, lesion, or ulcer.   DATA REVIEW:   CBC Recent Labs  Lab 09/09/17 0435  WBC 8.6  HGB 12.6  HCT 37.4  PLT 260    Chemistries  Recent Labs  Lab 09/08/17 0917 09/09/17 0435  NA 138 140  K 4.5 4.0  CL 103 108  CO2 24 25  GLUCOSE 106* 103*  BUN 48* 30*  CREATININE 0.90 0.71  CALCIUM 8.3* 8.2*  AST 21  --   ALT 6*  --   ALKPHOS 87  --   BILITOT 1.1  --     Cardiac Enzymes No results for input(s): TROPONINI in the last 168 hours.  Microbiology Results  Results for orders placed or performed during the hospital encounter of 09/08/17  Urine culture     Status: Abnormal   Collection Time: 09/08/17  9:18 AM  Result Value Ref Range Status   Specimen Description   Final    URINE, RANDOM Performed at [facility], [address], [place], [place] [address]    Special Requests   Final    NONE Performed at [facility], [address] [place], [place] [address]    Culture MULTIPLE SPECIES PRESENT, SUGGEST RECOLLECTION (A)  Final   Report Status 09/09/2017 FINAL  Final  Blood culture (single)     Status: None (Preliminary result)   Collection Time: 09/08/17  9:25 AM  Result Value Ref Range Status   Specimen Description BLOOD BLOOD LEFT FOREARM  Final   Special Requests   Final    BOTTLES DRAWN AEROBIC AND ANAEROBIC Blood Culture adequate volume   Culture   Final    NO GROWTH 3 DAYS Performed at [facility], [address], [place], [place] [address]    Report Status PENDING  Incomplete    RADIOLOGY:  No results found.  Follow up with PCP in 1 week.  Management plans discussed with the patient, family and they are in  agreement.  CODE STATUS: DNR    Code Status Orders  (From admission, onward)        Start     Ordered   09/08/17 1500  Do not attempt resuscitation (DNR)  Continuous    Question Answer Comment  In the event of cardiac or respiratory ARREST Do not call a "code blue"   In the event of cardiac or respiratory ARREST Do not perform Intubation, CPR, defibrillation or ACLS   In the event of cardiac or respiratory ARREST Use medication by any route, position, wound care, and other measures to relive pain and suffering. May use oxygen, suction and manual treatment of airway obstruction as needed for comfort.      09/08/17 1503    Code Status History  Date Active Date Inactive Code Status Order ID Comments User Context   07/21/2017 0857 07/22/2017 1504 DNR [id number]  Shaune Pollackhen, Qing, MD Inpatient    Advance Directive Documentation     Most Recent Value  Type of Advance Directive  Healthcare Power of Attorney, Living will, Out of facility DNR (pink MOST or yellow form)  Pre-existing out of facility DNR order (yellow form or pink MOST form)  Physician notified to receive inpatient order  "MOST" Form in Place?  -      TOTAL TIME TAKING CARE OF THIS PATIENT ON DAY OF DISCHARGE: more than 35 minutes.   Ihor AustinPavan Kiegan Macaraeg M.D on 09/11/2017 at 12:55 PM  Between 7am to 6pm - Pager - [phone]  After 6pm go to [email or web address] - password [email or web address]  [facility] [place] [facility]  Office  [phone]  CC: Primary care physician; Housecalls, Doctors Making  Note: This dictation was prepared with Dragon dictation along with smaller phrase technology. Any transcriptional errors that result from this process are unintentional.

## 2017-09-11 NOTE — Clinical Social Work Note (Signed)
CSW received return call from [facility] regarding patient. [facility] states that they are not able to accept patient at this time. CSW notified patient's son Wayne [place] [phone]. Son would like to pursue admission to [place] [facility]. CSW has contacted Diannia RuderKara, AdministratorN liaison for [facility] to pursue placement for patient. CSW will continue to follow for discharge planning.   Ruthe Mannanandace Maizey Menendez MSW, [address]CSWA [phone]

## 2017-09-13 LAB — CULTURE, BLOOD (SINGLE)
CULTURE: NO GROWTH
SPECIAL REQUESTS: ADEQUATE

## 2017-10-12 DEATH — deceased

## 2018-06-13 IMAGING — CT CT ABD-PELV W/ CM
2 of 5 series · 15 of 46 positions shown, 17 images · IV contrast (iopamidol)
Comparison: Lumbar radiographs 07/28/2017.  Left hip CT 07/17/2017.

CLINICAL DATA: [AGE] female with generalized abdominal pain.
Altered mental status since yesterday. Has been on antibiotics for 1
week.

EXAM:
CT ABDOMEN AND PELVIS WITH CONTRAST
TECHNIQUE: Multidetector CT imaging of the abdomen and pelvis was performed
using the standard protocol following bolus administration of
intravenous contrast.
CONTRAST:  100mL [id number] IOPAMIDOL ([id number]) INJECTION 61%

[Series 2: routine abd/pel with · axial · 0.76mm/px · z∈[-832,-436]mm · 12 of 89 slices shown, 14 images]
[im 5/89  soft-tissue]
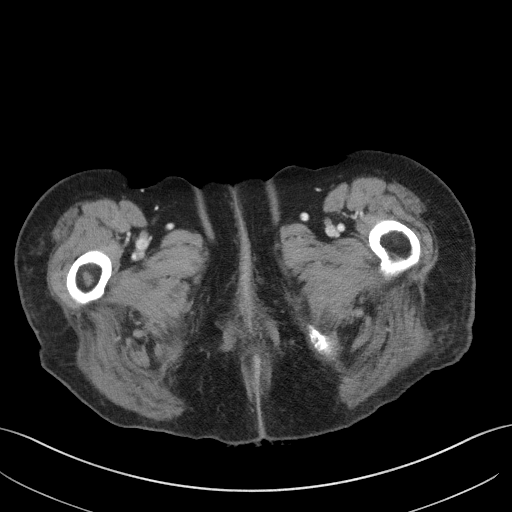
[im 5/89  bone]
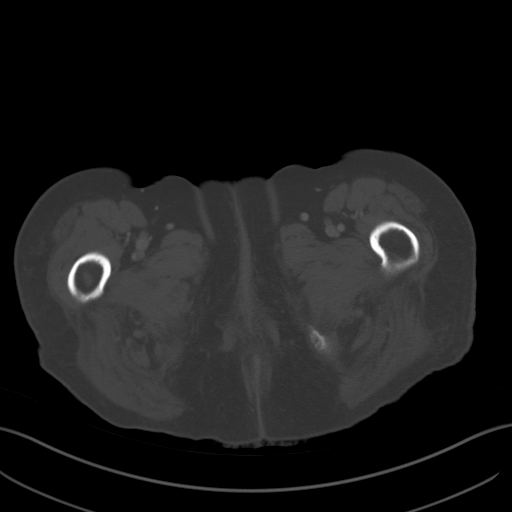
[im 14/89  soft-tissue]
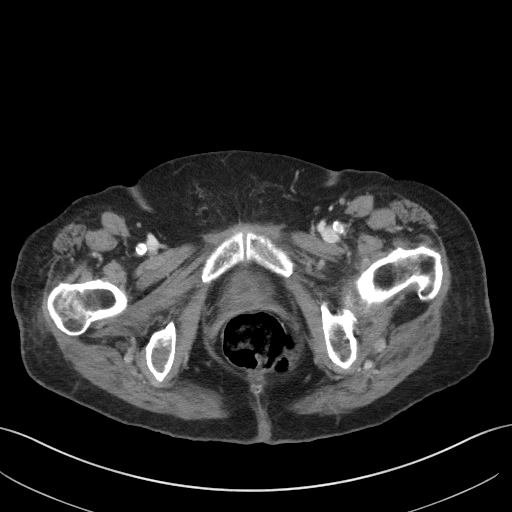
[im 19/89  soft-tissue]
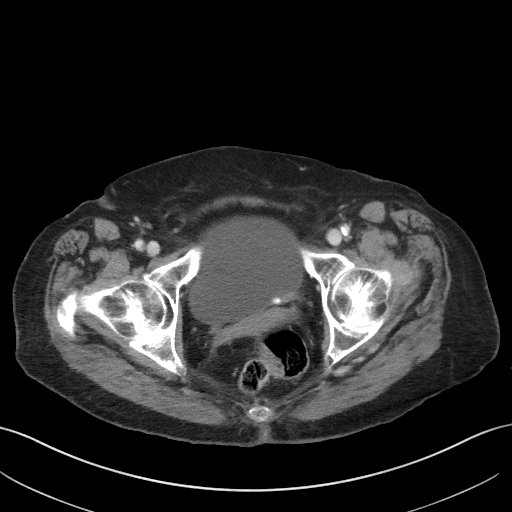
[im 28/89  soft-tissue]
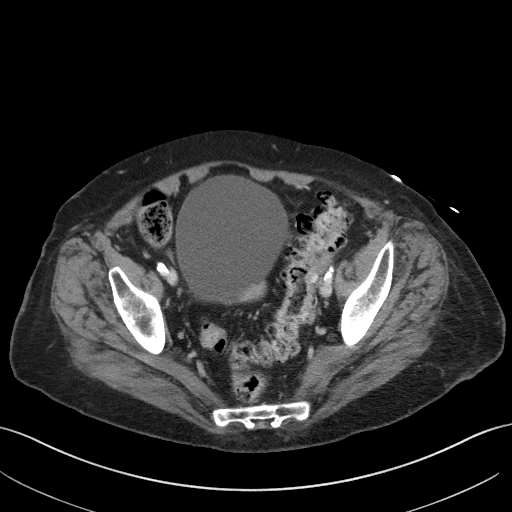
[im 33/89  soft-tissue]
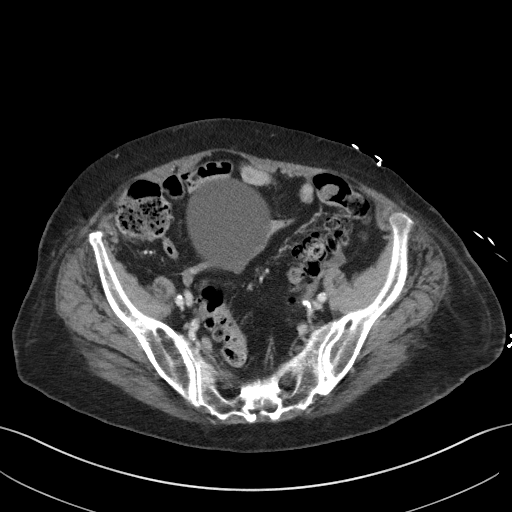
[im 42/89  soft-tissue]
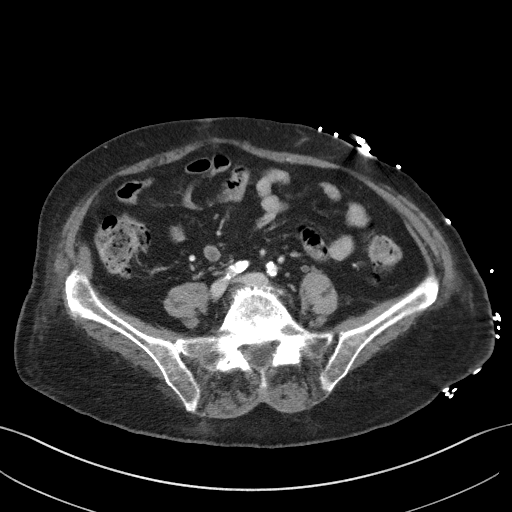
[im 47/89  soft-tissue]
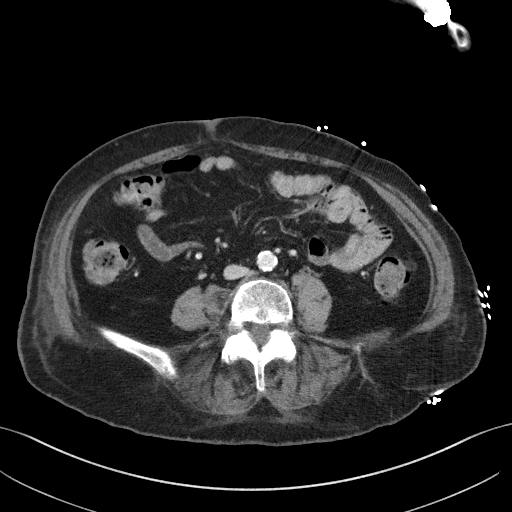
[im 56/89  soft-tissue]
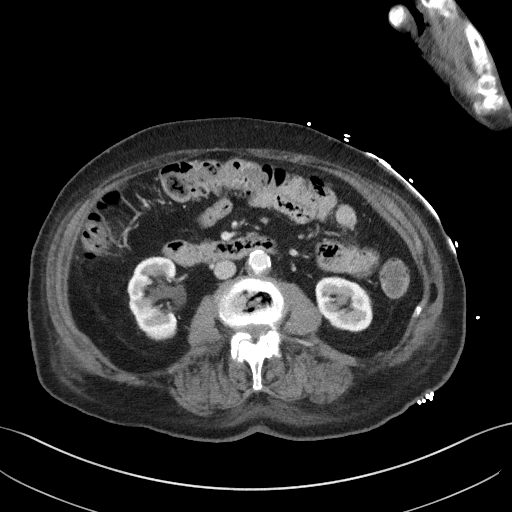
[im 61/89  soft-tissue]
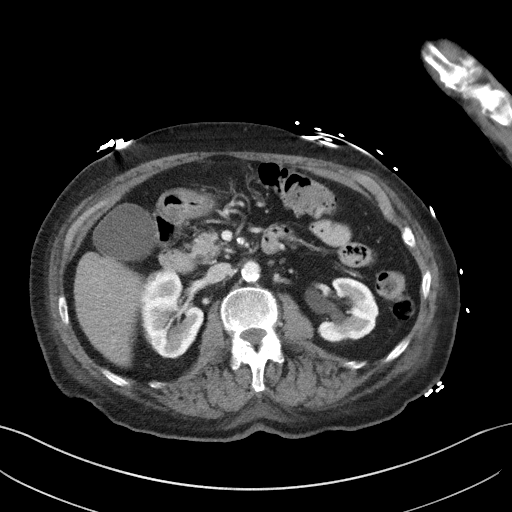
[im 61/89  bone]
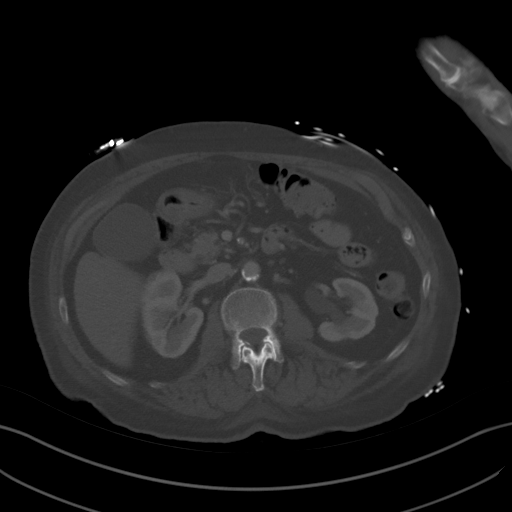
[im 70/89  soft-tissue]
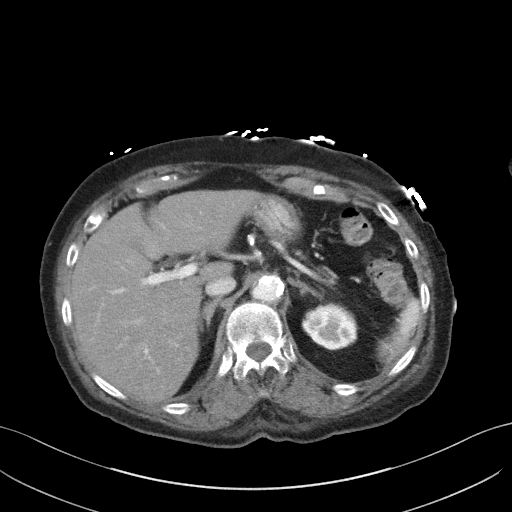
[im 75/89  soft-tissue]
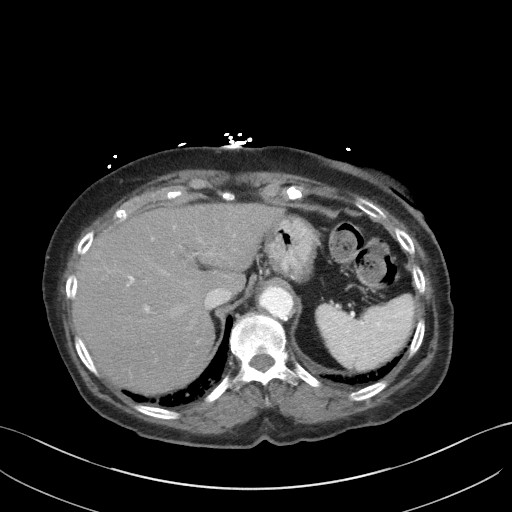
[im 84/89  soft-tissue]
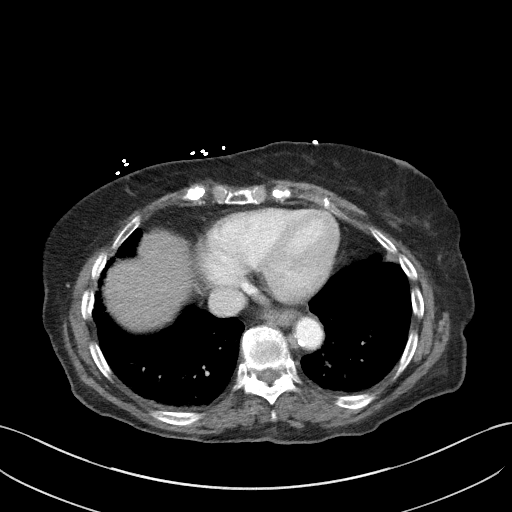

[Series 5: coronal st · coronal · 0.75mm/px · 3 of 82 slices shown]
[im 28/82  soft-tissue]
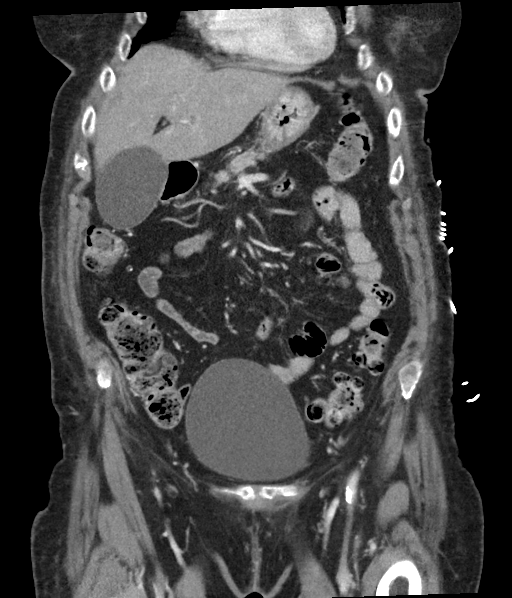
[im 37/82  soft-tissue]
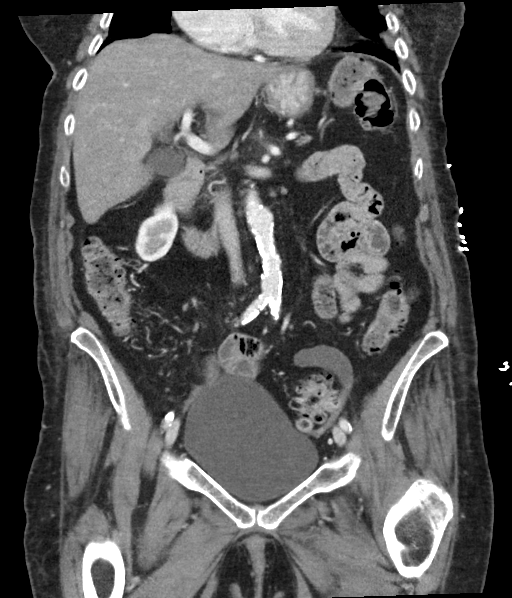
[im 46/82  soft-tissue]
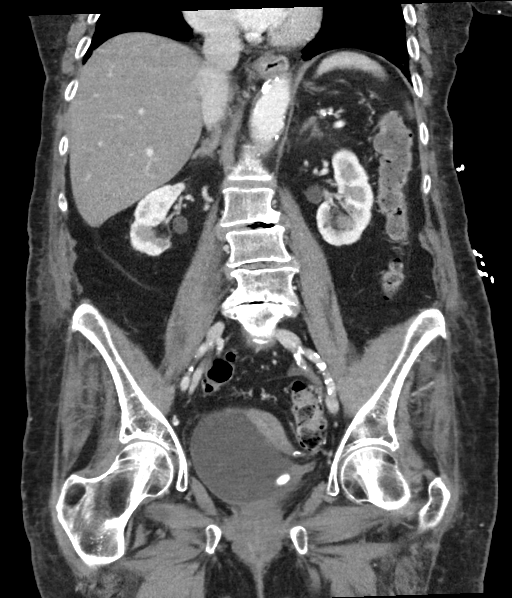

[15 of 46 positions shown; findings below may reference images not displayed]

FINDINGS: Lower chest: Mild cardiomegaly. No pericardial effusion. Calcified
aortic atherosclerosis.

Multiple chronic and benign appearing cystic areas in the lower
lobes, up to about 3 centimeters diameter). Superimposed subpleural
ground-glass opacity is probably atelectasis rather than chronic
scarring. No consolidation or pleural effusion at the lung bases.

Hepatobiliary: Hepatic steatosis. The gallbladder is mildly
distended without pericholecystic inflammation. No biliary ductal
enlargement.

Pancreas: Atrophied.

Spleen: Negative.

Adrenals/Urinary Tract: Normal adrenal glands.

Bilateral renal enhancement and contrast excretion is symmetric and
within normal limits. Both ureters appear symmetric and within
normal limits to the bladder. However, inside the urinary bladder
located near the left ureterovesical junction there is a large oval
8 x 11 x 9 millimeter calculus (AP by transverse by CC). The urinary
bladder is distended with an estimated bladder volume of 505
milliliters. No wall thickening or other bladder abnormality.

Stomach/Bowel: Negative rectum. Severe diverticulosis of the sigmoid
colon, but no active inflammation identified. Moderate to severe
diverticulosis of the descending colon without active inflammation.
Mild to moderate diverticulosis of the transverse colon without
active inflammation. The right colon is relatively spared. Negative
terminal ileum. Diminutive or absent appendix.

No dilated small bowel. Small gastric hiatal hernia. Decompressed
stomach and duodenum. No abdominal free fluid or free air.

Vascular/Lymphatic: Extensive Aortoiliac calcified atherosclerosis.
Major arterial structures in the abdomen and pelvis remain patent.
There is occlusion of both superficial femoral arteries (series 2,
image 79) while the bilateral PFA appear patent.

Patent portal venous system.

No lymphadenopathy.

Reproductive: Negative.

Other: No pelvic free fluid.

Musculoskeletal: Advanced lumbar disc degeneration with multilevel
vacuum disc. Incidental sacral Tarlov cyst(s) and spina bifida
occulta. No acute osseous abnormality identified. The left hip
appears stable.
IMPRESSION: 1. Age indeterminate occlusion of the bilateral proximal Superficial
Femoral Arteries. Correlate with lower extremity pulses and physical
exam. Underlying Aortic Atherosclerosis ([id number]) and
iliofemoral atherosclerosis. The proximal bilateral PFAs remain
patent.
2. Large 11 mm calculus within the urinary bladder located near the
left UVJ, but no associated ureteral or renal obstruction. Distended
urinary bladder, estimated bladder volume 505 mL.
3. Widespread large bowel diverticulosis, severe in the sigmoid
colon, but no active inflammation identified.
4. Chronic and benign appearing cystic pulmonary disease at both
lung bases.
5. Pancreatic atrophy.  Hepatic steatosis.

## 2018-06-13 IMAGING — DX DG CHEST 1V PORT
1 series · 1 of 1 positions shown · non-contrast
Comparison: Portable chest x-ray August 03, 2017

CLINICAL DATA: Altered mental status since yesterday. Recently
found to have active TB and has been on antibiotics for the past
week.

EXAM:
PORTABLE CHEST 1 VIEW

[chest ap]
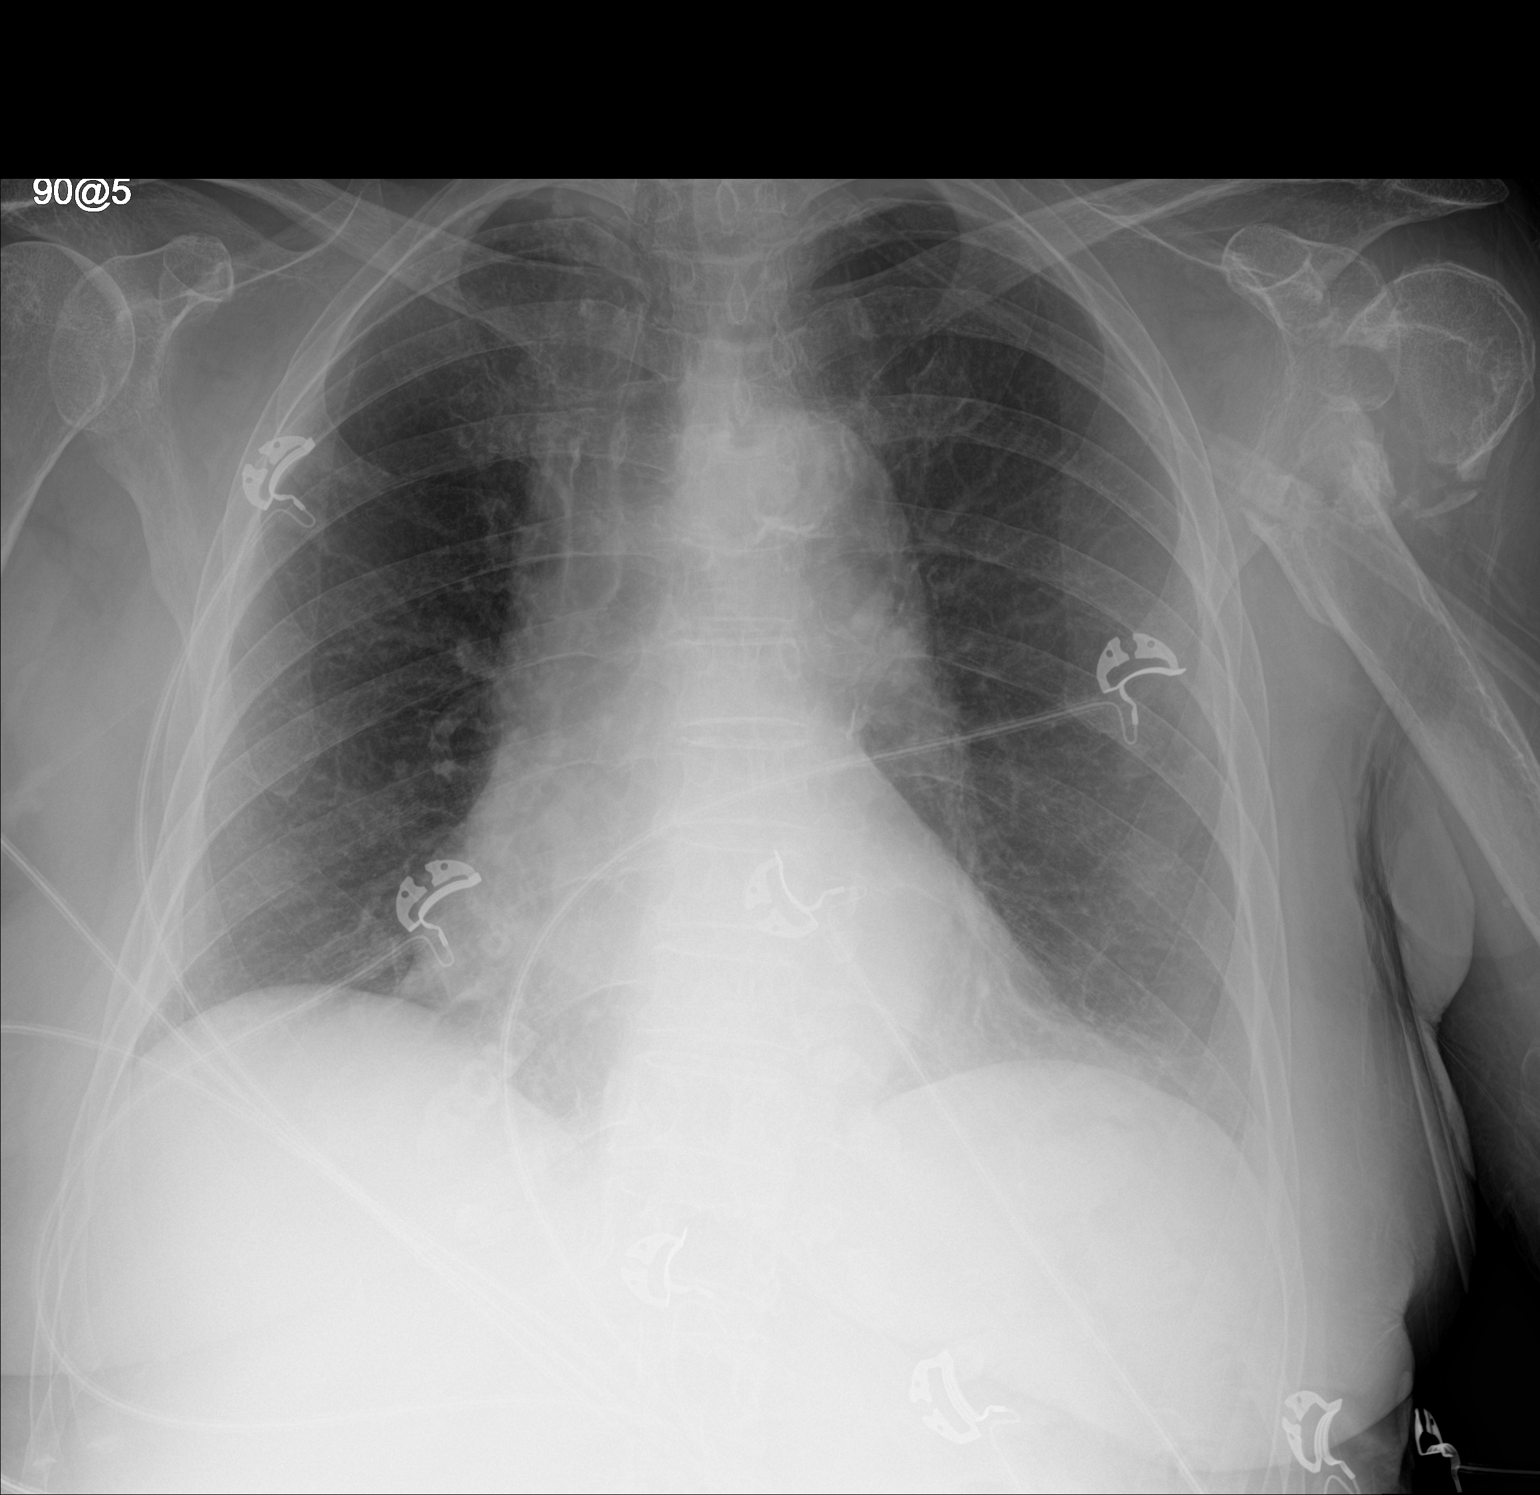

[1 of 1 positions shown; findings below may reference images not displayed]

FINDINGS: The lungs are well-expanded and clear. The heart is top-normal in
size but stable. The pulmonary vascularity is normal. The
mediastinum is normal in width. There is calcification in the wall
of the thoracic aorta. There is a chronic non healed fracture of the
left femoral neck with deformity.
IMPRESSION: No pneumonia, CHF, nor other acute cardiopulmonary abnormality.

Thoracic aortic atherosclerosis.
